# Patient Record
Sex: Male | Born: 1985 | Race: Black or African American | Hispanic: No | Marital: Single | Smoking: Former smoker
Health system: Southern US, Community
[De-identification: ages and names within clinical notes are randomized; demographics above are authoritative.]

## PROBLEM LIST (undated history)

## (undated) DIAGNOSIS — T783XXA Angioneurotic edema, initial encounter: Secondary | ICD-10-CM

## (undated) DIAGNOSIS — J45909 Unspecified asthma, uncomplicated: Secondary | ICD-10-CM

## (undated) HISTORY — PX: LEG SURGERY: SHX1003

## (undated) HISTORY — DX: Angioneurotic edema, initial encounter: T78.3XXA

---

## 2015-07-30 ENCOUNTER — Emergency Department (HOSPITAL_COMMUNITY)
Admission: EM | Admit: 2015-07-30 | Discharge: 2015-07-30 | Disposition: A | Discharge status: Home / Self Care | Payer: Medicaid - Out of State | Attending: Emergency Medicine | Admitting: Emergency Medicine

## 2015-07-30 ENCOUNTER — Encounter (HOSPITAL_COMMUNITY): Payer: Self-pay | Admitting: Emergency Medicine

## 2015-07-30 DIAGNOSIS — M25562 Pain in left knee: Secondary | ICD-10-CM | POA: Insufficient documentation

## 2015-07-30 DIAGNOSIS — F17210 Nicotine dependence, cigarettes, uncomplicated: Secondary | ICD-10-CM | POA: Insufficient documentation

## 2015-07-30 DIAGNOSIS — J45909 Unspecified asthma, uncomplicated: Secondary | ICD-10-CM | POA: Diagnosis not present

## 2015-07-30 HISTORY — DX: Unspecified asthma, uncomplicated: J45.909

## 2015-07-30 MED ORDER — IBUPROFEN 800 MG PO TABS: 800.0000 mg | ORAL_TABLET | Freq: Three times a day (TID) | ORAL | Status: DC

## 2015-07-30 NOTE — Discharge Instructions (Signed)
Cryotherapy °Cryotherapy means treatment with cold. Ice or gel packs can be used to reduce both pain and swelling. Ice is the most helpful within the first 24 to 48 hours after an injury or flare-up from overusing a muscle or joint. Sprains, strains, spasms, burning pain, shooting pain, and aches can all be eased with ice. Ice can also be used when recovering from surgery. Ice is effective, has very few side effects, and is safe for most people to use. °PRECAUTIONS  °Ice is not a safe treatment option for people with: °· Raynaud phenomenon. This is a condition affecting small blood vessels in the extremities. Exposure to cold may cause your problems to return. °· Cold hypersensitivity. There are many forms of cold hypersensitivity, including: °· Cold urticaria. Red, itchy hives appear on the skin when the tissues begin to warm after being iced. °· Cold erythema. This is a red, itchy rash caused by exposure to cold. °· Cold hemoglobinuria. Red blood cells break down when the tissues begin to warm after being iced. The hemoglobin that carry oxygen are passed into the urine because they cannot combine with blood proteins fast enough. °· Numbness or altered sensitivity in the area being iced. °If you have any of the following conditions, do not use ice until you have discussed cryotherapy with your caregiver: °· Heart conditions, such as arrhythmia, angina, or chronic heart disease. °· High blood pressure. °· Healing wounds or open skin in the area being iced. °· Current infections. °· Rheumatoid arthritis. °· Poor circulation. °· Diabetes. °Ice slows the blood flow in the region it is applied. This is beneficial when trying to stop inflamed tissues from spreading irritating chemicals to surrounding tissues. However, if you expose your skin to cold temperatures for too long or without the proper protection, you can damage your skin or nerves. Watch for signs of skin damage due to cold. °HOME CARE INSTRUCTIONS °Follow  these tips to use ice and cold packs safely. °· Place a dry or damp towel between the ice and skin. A damp towel will cool the skin more quickly, so you may need to shorten the time that the ice is used. °· For a more rapid response, add gentle compression to the ice. °· Ice for no more than 10 to 20 minutes at a time. The bonier the area you are icing, the less time it will take to get the benefits of ice. °· Check your skin after 5 minutes to make sure there are no signs of a poor response to cold or skin damage. °· Rest 20 minutes or more between uses. °· Once your skin is numb, you can end your treatment. You can test numbness by very lightly touching your skin. The touch should be so light that you do not see the skin dimple from the pressure of your fingertip. When using ice, most people will feel these normal sensations in this order: cold, burning, aching, and numbness. °· Do not use ice on someone who cannot communicate their responses to pain, such as small children or people with dementia. °HOW TO MAKE AN ICE PACK °Ice packs are the most common way to use ice therapy. Other methods include ice massage, ice baths, and cryosprays. Muscle creams that cause a cold, tingly feeling do not offer the same benefits that ice offers and should not be used as a substitute unless recommended by your caregiver. °To make an ice pack, do one of the following: °· Place crushed ice or a   bag of frozen vegetables in a sealable plastic bag. Squeeze out the excess air. Place this bag inside another plastic bag. Slide the bag into a pillowcase or place a damp towel between your skin and the bag. °· Mix 3 parts water with 1 part rubbing alcohol. Freeze the mixture in a sealable plastic bag. When you remove the mixture from the freezer, it will be slushy. Squeeze out the excess air. Place this bag inside another plastic bag. Slide the bag into a pillowcase or place a damp towel between your skin and the bag. °SEEK MEDICAL CARE  IF: °· You develop white spots on your skin. This may give the skin a blotchy (mottled) appearance. °· Your skin turns blue or pale. °· Your skin becomes waxy or hard. °· Your swelling gets worse. °MAKE SURE YOU:  °· Understand these instructions. °· Will watch your condition. °· Will get help right away if you are not doing well or get worse. °  °This information is not intended to replace advice given to you by your health care provider. Make sure you discuss any questions you have with your health care provider. °  °Document Released: 09/12/2010 Document Revised: 02/06/2014 Document Reviewed: 09/12/2010 °[employer]vier Interactive Patient Education ©2016 [employer] ° °Joint Pain °Joint pain, which is also called arthralgia, can be caused by many things. Joint pain often goes away when you follow your health care provider's instructions for relieving pain at home. However, joint pain can also be caused by conditions that require further treatment. Common causes of joint pain include: °· Bruising in the area of the joint. °· Overuse of the joint. °· Wear and tear on the joints that occur with aging (osteoarthritis). °· Various other forms of arthritis. °· A buildup of a crystal form of uric acid in the joint (gout). °· Infections of the joint (septic arthritis) or of the bone (osteomyelitis). °Your health care provider may recommend medicine to help with the pain. If your joint pain continues, additional tests may be needed to diagnose your condition. °HOME CARE INSTRUCTIONS °Watch your condition for any changes. Follow these instructions as directed to lessen the pain that you are feeling. °· Take medicines only as directed by your health care provider. °· Rest the affected area for as long as your health care provider says that you should. If directed to do so, raise the painful joint above the level of your heart while you are sitting or lying down. °· Do not do things that cause or worsen pain. °· If directed,  apply ice to the painful area: °¨ Put ice in a plastic bag. °¨ Place a towel between your skin and the bag. °¨ Leave the ice on for 20 minutes, 2-3 times per day. °· Wear an elastic bandage, splint, or sling as directed by your health care provider. Loosen the elastic bandage or splint if your fingers or toes become numb and tingle, or if they turn cold and blue. °· Begin exercising or stretching the affected area as directed by your health care provider. Ask your health care provider what types of exercise are safe for you. °· Keep all follow-up visits as directed by your health care provider. This is important. °SEEK MEDICAL CARE IF: °· Your pain increases, and medicine does not help. °· Your joint pain does not improve within 3 days. °· You have increased bruising or swelling. °· You have a fever. °· You lose 10 lb (4.5 kg) or more without trying. °SEEK IMMEDIATE   MEDICAL CARE IF: °· You are not able to move the joint. °· Your fingers or toes become numb or they turn cold and blue. °  °This information is not intended to replace advice given to you by your health care provider. Make sure you discuss any questions you have with your health care provider. °  °Document Released: 01/16/2005 Document Revised: 02/06/2014 Document Reviewed: 10/28/2013 °Elsevier Interactive Patient Education ©2016 [employer] ° °

## 2015-07-30 NOTE — ED Notes (Signed)
Patient c/o left knee pain x1 month. Denies injury to same. No swelling, numbness or tingling.

## 2015-07-30 NOTE — ED Provider Notes (Signed)
CSN: [id number]     Arrival date & time 07/30/15  0021 History   First MD Initiated Contact with Patient 07/30/15 0330     No chief complaint on file.    (Consider location/radiation/quality/duration/timing/severity/associated sxs/prior Treatment) HPI Comments: Patient with left knee pain x 1 month without known injury. No swelling. Pain has now started to affect the left ankle. He states pain is worse when he is at rest and better when he walking. No fever, redness. Pain affects the joints only, no calf or thigh pain.  The history is provided by the patient. No language interpreter was used.    Past Medical History  Diagnosis Date  . Asthma    Past Surgical History  Procedure Laterality Date  . Leg surgery     No family history on file. Social History  Substance Use Topics  . Smoking status: Current Every Day Smoker -- 0.50 packs/day    Types: Cigarettes  . Smokeless tobacco: None  . Alcohol Use: Yes    Review of Systems  Constitutional: Negative for fever and chills.  Musculoskeletal:       See HPI  Skin: Negative.   Neurological: Negative.  Negative for weakness and numbness.      Allergies  Review of patient's allergies indicates no known allergies.  Home Medications   Prior to Admission medications   Not on File   BP 127/84 mmHg  Pulse 102  Temp(Src) 98.5 F (36.9 C) (Oral)  Resp 20  Ht 6\' 1"  (1.854 m)  Wt 117.935 kg  BMI 34.31 kg/m2  SpO2 98% Physical Exam  Constitutional: He is oriented to person, place, and time. He appears well-developed and well-nourished. No distress.  Pulmonary/Chest: Effort normal.  Musculoskeletal: Normal range of motion.  Left knee and ankle has FROM without swelling, redness, or warmth. Joints stable without laxity.  Neurological: He is alert and oriented to person, place, and time.  Skin: Skin is warm and dry. No erythema.  Psychiatric: He has a normal mood and affect.    ED Course  Procedures (including critical  care time) Labs Review Labs Reviewed - No data to display  Imaging Review No results found. I have personally reviewed and evaluated these images and lab results as part of my medical decision-making.   EKG Interpretation None      MDM   Final diagnoses:  None    1. Left knee pain  No evidence septic joint, DVT or injury. Will provide ibuprofen and orthopedic referral if pain does not improve.    Elpidio AnisShari Arleta Ostrum, PA-C 07/30/15 0407  Cy BlamerApril Palumbo, MD 07/30/15 [phone]

## 2018-02-25 ENCOUNTER — Other Ambulatory Visit: Payer: Self-pay

## 2018-02-25 ENCOUNTER — Encounter (HOSPITAL_COMMUNITY): Payer: Self-pay | Admitting: Emergency Medicine

## 2018-02-25 ENCOUNTER — Emergency Department (HOSPITAL_COMMUNITY)
Admission: EM | Admit: 2018-02-25 | Discharge: 2018-02-25 | Disposition: A | Discharge status: Home / Self Care | Payer: Medicaid - Out of State | Attending: Emergency Medicine | Admitting: Emergency Medicine

## 2018-02-25 DIAGNOSIS — F17210 Nicotine dependence, cigarettes, uncomplicated: Secondary | ICD-10-CM | POA: Insufficient documentation

## 2018-02-25 DIAGNOSIS — J45909 Unspecified asthma, uncomplicated: Secondary | ICD-10-CM | POA: Insufficient documentation

## 2018-02-25 DIAGNOSIS — J111 Influenza due to unidentified influenza virus with other respiratory manifestations: Secondary | ICD-10-CM | POA: Insufficient documentation

## 2018-02-25 DIAGNOSIS — R6889 Other general symptoms and signs: Secondary | ICD-10-CM

## 2018-02-25 MED ORDER — OSELTAMIVIR PHOSPHATE 75 MG PO CAPS: 75.0000 mg | ORAL_CAPSULE | Freq: Two times a day (BID) | ORAL | 0 refills | Status: DC

## 2018-02-25 NOTE — ED Triage Notes (Signed)
States family recently diagnosed with flu and recently developed general body achy, hot and cold chills, and sore throat.

## 2018-02-25 NOTE — ED Provider Notes (Signed)
[facility]   CSN: [id number] Arrival date & time: 02/25/18  0940     History   Chief Complaint Chief Complaint  Patient presents with  . Sore Throat  . Cough  . Generalized Body Aches    HPI Jonathan Whitney is a 33 y.o. male.  HPI   33 year old male with a significant past medical history of asthma presents today with complaints of flulike symptoms.  Patient notes yesterday he developed dry nonproductive cough, fever, body aches, sore throat.  Patient notes both his wife and daughter were diagnosed with influenza after being tested and had similar symptoms.  Patient notes that he did receive the flu vaccine this year.  Patient notes that he has inhaler at home but has not been using it as he has not had any significant respiratory complaints.  Patient notes taking 1 Tamiflu this morning.  Past Medical History:  Diagnosis Date  . Asthma     There are no active problems to display for this patient.   Past Surgical History:  Procedure Laterality Date  . LEG SURGERY          Home Medications    Prior to Admission medications   Medication Sig Start Date End Date Taking? Authorizing Provider  ibuprofen (ADVIL,MOTRIN) 800 MG tablet Take 1 tablet (800 mg total) by mouth 3 (three) times daily. 07/30/15   Elpidio AnisUpstill, Shari, PA-C    Family History No family history on file.  Social History Social History   Tobacco Use  . Smoking status: Current Every Day Smoker    Packs/day: 0.50    Types: Cigarettes  Substance Use Topics  . Alcohol use: Yes  . Drug use: No     Allergies   Patient has no known allergies.   Review of Systems Review of Systems  All other systems reviewed and are negative.    Physical Exam Updated Vital Signs BP (!) 165/107   Pulse 80   Temp 99.2 F (37.3 C) (Oral)   Resp 18   Ht 6\' 1"  (1.854 m)   Wt (!) 138.3 kg   SpO2 99%   BMI 40.24 kg/m   Physical Exam Vitals signs and  nursing note reviewed.  Constitutional:      Appearance: He is well-developed.  HENT:     Head: Normocephalic and atraumatic.     Comments: Oropharynx with minor erythema, no exudate or swelling no pooling of secretions Eyes:     General: No scleral icterus.       Right eye: No discharge.        Left eye: No discharge.     Conjunctiva/sclera: Conjunctivae normal.     Pupils: Pupils are equal, round, and reactive to light.  Neck:     Musculoskeletal: Normal range of motion.     Vascular: No JVD.     Trachea: No tracheal deviation.  Cardiovascular:     Rate and Rhythm: Normal rate and regular rhythm.  Pulmonary:     Effort: Pulmonary effort is normal. No respiratory distress.     Breath sounds: No stridor. No wheezing or rales.  Neurological:     Mental Status: He is alert and oriented to person, place, and time.     Coordination: Coordination normal.  Psychiatric:        Behavior: Behavior normal.        Thought Content: Thought content normal.        Judgment: Judgment normal.  ED Treatments / Results  Labs (all labs ordered are listed, but only abnormal results are displayed) Labs Reviewed - No data to display  EKG None  Radiology No results found.  Procedures Procedures (including critical care time)  Medications Ordered in ED Medications - No data to display   Initial Impression / Assessment and Plan / ED Course  I have reviewed the triage vital signs and the nursing notes.  Pertinent labs & imaging results that were available during my care of the patient were reviewed by me and considered in my medical decision making (see chart for details).     33 year old male presents today with flulike symptoms.  Patient has 2 close sick contacts at home with positive flu he is exhibiting flulike symptoms.  I discussed testing and treatment, although I would recommend Tamiflu in this patient given his acute onset of symptoms within the timeframe for treatment and  with a history of asthma.  Patient understands risks of medication.  No testing necessary in this case he will receive Tamiflu, return immediately if develops any new or worsening signs or symptoms.  Patient verbalized understanding and agreement to today's plan had no further questions or concerns.  Final Clinical Impressions(s) / ED Diagnoses   Final diagnoses:  Flu-like symptoms    ED Discharge Orders    None       Rosalio Loud 02/25/18 0957    Blane Ohara, MD 02/26/18 [phone]

## 2018-02-25 NOTE — Discharge Instructions (Addendum)
Please read attached information. If you experience any new or worsening signs or symptoms please return to the emergency room for evaluation. Please follow-up with your primary care provider or specialist as discussed. Please use medication prescribed only as directed and discontinue taking if you have any concerning signs or symptoms.  You may return to work 24 hours after your fever dissipates.

## 2019-07-10 ENCOUNTER — Other Ambulatory Visit: Payer: Self-pay

## 2019-07-10 ENCOUNTER — Encounter: Payer: Self-pay | Admitting: Family Medicine

## 2019-07-10 ENCOUNTER — Ambulatory Visit (INDEPENDENT_AMBULATORY_CARE_PROVIDER_SITE_OTHER): Payer: No Typology Code available for payment source | Admitting: Family Medicine

## 2019-07-10 VITALS — BP 130/84 | HR 114 | Temp 97.2°F | Ht 71.0 in | Wt 284.0 lb

## 2019-07-10 DIAGNOSIS — J301 Allergic rhinitis due to pollen: Secondary | ICD-10-CM

## 2019-07-10 DIAGNOSIS — F341 Dysthymic disorder: Secondary | ICD-10-CM | POA: Diagnosis not present

## 2019-07-10 DIAGNOSIS — Z0000 Encounter for general adult medical examination without abnormal findings: Secondary | ICD-10-CM | POA: Diagnosis not present

## 2019-07-10 DIAGNOSIS — J4520 Mild intermittent asthma, uncomplicated: Secondary | ICD-10-CM

## 2019-07-10 DIAGNOSIS — R7989 Other specified abnormal findings of blood chemistry: Secondary | ICD-10-CM

## 2019-07-10 DIAGNOSIS — E559 Vitamin D deficiency, unspecified: Secondary | ICD-10-CM

## 2019-07-10 NOTE — Patient Instructions (Signed)
Health Maintenance, Male Adopting a healthy lifestyle and getting preventive care are important in promoting health and wellness. Ask your health care provider about:  The right schedule for you to have regular tests and exams.  Things you can do on your own to prevent diseases and keep yourself healthy. What should I know about diet, weight, and exercise? Eat a healthy diet   Eat a diet that includes plenty of vegetables, fruits, low-fat dairy products, and lean protein.  Do not eat a lot of foods that are high in solid fats, added sugars, or sodium. Maintain a healthy weight Body mass index (BMI) is a measurement that can be used to identify possible weight problems. It estimates body fat based on height and weight. Your health care provider can help determine your BMI and help you achieve or maintain a healthy weight. Get regular exercise Get regular exercise. This is one of the most important things you can do for your health. Most adults should:  Exercise for at least 150 minutes each week. The exercise should increase your heart rate and make you sweat (moderate-intensity exercise).  Do strengthening exercises at least twice a week. This is in addition to the moderate-intensity exercise.  Spend less time sitting. Even light physical activity can be beneficial. Watch cholesterol and blood lipids Have your blood tested for lipids and cholesterol at 36 years of age, then have this test every 5 years. You may need to have your cholesterol levels checked more often if:  Your lipid or cholesterol levels are high.  You are older than 36 years of age.  You are at high risk for heart disease. What should I know about cancer screening? Many types of cancers can be detected early and may often be prevented. Depending on your health history and family history, you may need to have cancer screening at various ages. This may include screening for:  Colorectal cancer.  Prostate  cancer.  Skin cancer.  Lung cancer. What should I know about heart disease, diabetes, and high blood pressure? Blood pressure and heart disease  High blood pressure causes heart disease and increases the risk of stroke. This is more likely to develop in people who have high blood pressure readings, are of African descent, or are overweight.  Talk with your health care provider about your target blood pressure readings.  Have your blood pressure checked: ? Every 3-5 years if you are 18-39 years of age. ? Every year if you are 40 years old or older.  If you are between the ages of 65 and 75 and are a current or former smoker, ask your health care provider if you should have a one-time screening for abdominal aortic aneurysm (AAA). Diabetes Have regular diabetes screenings. This checks your fasting blood sugar level. Have the screening done:  Once every three years after age 45 if you are at a normal weight and have a low risk for diabetes.  More often and at a younger age if you are overweight or have a high risk for diabetes. What should I know about preventing infection? Hepatitis B If you have a higher risk for hepatitis B, you should be screened for this virus. Talk with your health care provider to find out if you are at risk for hepatitis B infection. Hepatitis C Blood testing is recommended for:  Everyone born from 1945 through 1965.  Anyone with known risk factors for hepatitis C. Sexually transmitted infections (STIs)  You should be screened each year   for STIs, including gonorrhea and chlamydia, if: ? You are sexually active and are younger than 36 years of age. ? You are older than 36 years of age and your health care provider tells you that you are at risk for this type of infection. ? Your sexual activity has changed since you were last screened, and you are at increased risk for chlamydia or gonorrhea. Ask your health care provider if you are at risk.  Ask your  health care provider about whether you are at high risk for HIV. Your health care provider may recommend a prescription medicine to help prevent HIV infection. If you choose to take medicine to prevent HIV, you should first get tested for HIV. You should then be tested every 3 months for as long as you are taking the medicine. Follow these instructions at home: Lifestyle  Do not use any products that contain nicotine or tobacco, such as cigarettes, e-cigarettes, and chewing tobacco. If you need help quitting, ask your health care provider.  Do not use street drugs.  Do not share needles.  Ask your health care provider for help if you need support or information about quitting drugs. Alcohol use  Do not drink alcohol if your health care provider tells you not to drink.  If you drink alcohol: ? Limit how much you have to 0-2 drinks a day. ? Be aware of how much alcohol is in your drink. In the [place] one drink equals one 12 oz bottle of beer (355 mL), one 5 oz glass of wine (148 mL), or one 1 oz glass of hard liquor (44 mL). General instructions  Schedule regular health, dental, and eye exams.  Stay current with your vaccines.  Tell your health care provider if: ? You often feel depressed. ? You have ever been abused or do not feel safe at home. Summary  Adopting a healthy lifestyle and getting preventive care are important in promoting health and wellness.  Follow your health care provider's instructions about healthy diet, exercising, and getting tested or screened for diseases.  Follow your health care provider's instructions on monitoring your cholesterol and blood pressure. This information is not intended to replace advice given to you by your health care provider. Make sure you discuss any questions you have with your health care provider. Document Revised: 01/09/2018 Document Reviewed: 01/09/2018 Elsevier Patient Education  2020 [place].  BMI for Adults What is  BMI? Body mass index (BMI) is a number that is calculated from a person's weight and height. BMI can help estimate how much of a person's weight is composed of fat. BMI does not measure body fat directly. Rather, it is an alternative to procedures that directly measure body fat, which can be difficult and expensive. BMI can help identify people who may be at higher risk for certain medical problems. What are BMI measurements used for? BMI is used as a screening tool to identify possible weight problems. It helps determine whether a person is obese, overweight, a healthy weight, or underweight. BMI is useful for:  Identifying a weight problem that may be related to a medical condition or may increase the risk for medical problems.  Promoting changes, such as changes in diet and exercise, to help reach a healthy weight. BMI screening can be repeated to see if these changes are working. How is BMI calculated? BMI involves measuring your weight in relation to your height. Both height and weight are measured, and the BMI is calculated from those  numbers. This can be done either in [place] ([place] or metric measurements. Note that charts and online BMI calculators are available to help you find your BMI quickly and easily without having to do these calculations yourself. To calculate your BMI in English ([place] measurements:  1. Measure your weight in pounds (lb). 2. Multiply the number of pounds by 703. ? For example, for a person who weighs 180 lb, multiply that number by 703, which equals 126,540. 3. Measure your height in inches. Then multiply that number by itself to get a measurement called "inches squared." ? For example, for a person who is 70 inches tall, the "inches squared" measurement is 70 inches x 70 inches, which equals 4,900 inches squared. 4. Divide the total from step 2 (number of lb x 703) by the total from step 3 (inches squared): 126,540  4,900 = 25.8. This is your BMI. To calculate  your BMI in metric measurements: 1. Measure your weight in kilograms (kg). 2. Measure your height in meters (m). Then multiply that number by itself to get a measurement called "meters squared." ? For example, for a person who is 1.75 m tall, the "meters squared" measurement is 1.75 m x 1.75 m, which is equal to 3.1 meters squared. 3. Divide the number of kilograms (your weight) by the meters squared number. In this example: 70  3.1 = 22.6. This is your BMI. What do the results mean? BMI charts are used to identify whether you are underweight, normal weight, overweight, or obese. The following guidelines will be used:  Underweight: BMI less than 18.5.  Normal weight: BMI between 18.5 and 24.9.  Overweight: BMI between 25 and 29.9.  Obese: BMI of 30 or above. Keep these notes in mind:  Weight includes both fat and muscle, so someone with a muscular build, such as an athlete, may have a BMI that is higher than 24.9. In cases like these, BMI is not an accurate measure of body fat.  To determine if excess body fat is the cause of a BMI of 25 or higher, further assessments may need to be done by a health care provider.  BMI is usually interpreted in the same way for men and women. Where to find more information For more information about BMI, including tools to quickly calculate your BMI, go to these websites:  Centers for Disease Control and Prevention: http://www.wolf.info/  American Heart Association: www.heart.org  National Heart, Lung, and Blood Institute: https://wilson-eaton.com/ Summary  Body mass index (BMI) is a number that is calculated from a person's weight and height.  BMI may help estimate how much of a person's weight is composed of fat. BMI can help identify those who may be at higher risk for certain medical problems.  BMI can be measured using English measurements or metric measurements.  BMI charts are used to identify whether you are underweight, normal weight, overweight, or  obese. This information is not intended to replace advice given to you by your health care provider. Make sure you discuss any questions you have with your health care provider. Document Revised: 10/09/2018 Document Reviewed: 08/16/2018 Elsevier Patient Education  [place] 23-68 Years Old, Male Preventive care refers to lifestyle choices and visits with your health care provider that can promote health and wellness. This includes:  A yearly physical exam. This is also called an annual well check.  Regular dental and eye exams.  Immunizations.  Screening for certain conditions.  Healthy lifestyle choices, such  as eating a healthy diet, getting regular exercise, not using drugs or products that contain nicotine and tobacco, and limiting alcohol use. What can I expect for my preventive care visit? Physical exam Your health care provider will check:  Height and weight. These may be used to calculate body mass index (BMI), which is a measurement that tells if you are at a healthy weight.  Heart rate and blood pressure.  Your skin for abnormal spots. Counseling Your health care provider may ask you questions about:  Alcohol, tobacco, and drug use.  Emotional well-being.  Home and relationship well-being.  Sexual activity.  Eating habits.  Work and work Statistician. What immunizations do I need?  Influenza (flu) vaccine  This is recommended every year. Tetanus, diphtheria, and pertussis (Tdap) vaccine  You may need a Td booster every 10 years. Varicella (chickenpox) vaccine  You may need this vaccine if you have not already been vaccinated. Human papillomavirus (HPV) vaccine  If recommended by your health care provider, you may need three doses over 6 months. Measles, mumps, and rubella (MMR) vaccine  You may need at least one dose of MMR. You may also need a second dose. Meningococcal conjugate (MenACWY) vaccine  One dose is recommended  if you are 7-74 years old and a Market researcher living in a residence hall, or if you have one of several medical conditions. You may also need additional booster doses. Pneumococcal conjugate (PCV13) vaccine  You may need this if you have certain conditions and were not previously vaccinated. Pneumococcal polysaccharide (PPSV23) vaccine  You may need one or two doses if you smoke cigarettes or if you have certain conditions. Hepatitis A vaccine  You may need this if you have certain conditions or if you travel or work in places where you may be exposed to hepatitis A. Hepatitis B vaccine  You may need this if you have certain conditions or if you travel or work in places where you may be exposed to hepatitis B. Haemophilus influenzae type b (Hib) vaccine  You may need this if you have certain risk factors. You may receive vaccines as individual doses or as more than one vaccine together in one shot (combination vaccines). Talk with your health care provider about the risks and benefits of combination vaccines. What tests do I need? Blood tests  Lipid and cholesterol levels. These may be checked every 5 years starting at age 46.  Hepatitis C test.  Hepatitis B test. Screening   Diabetes screening. This is done by checking your blood sugar (glucose) after you have not eaten for a while (fasting).  Sexually transmitted disease (STD) testing. Talk with your health care provider about your test results, treatment options, and if necessary, the need for more tests. Follow these instructions at home: Eating and drinking   Eat a diet that includes fresh fruits and vegetables, whole grains, lean protein, and low-fat dairy products.  Take vitamin and mineral supplements as recommended by your health care provider.  Do not drink alcohol if your health care provider tells you not to drink.  If you drink alcohol: ? Limit how much you have to 0-2 drinks a day. ? Be aware of  how much alcohol is in your drink. In the [place] one drink equals one 12 oz bottle of beer (355 mL), one 5 oz glass of wine (148 mL), or one 1 oz glass of hard liquor (44 mL). Lifestyle  Take daily care of your teeth and gums.  Stay active. Exercise for at least 30 minutes on 5 or more days each week.  Do not use any products that contain nicotine or tobacco, such as cigarettes, e-cigarettes, and chewing tobacco. If you need help quitting, ask your health care provider.  If you are sexually active, practice safe sex. Use a condom or other form of protection to prevent STIs (sexually transmitted infections). What's next?  Go to your health care provider once a year for a well check visit.  Ask your health care provider how often you should have your eyes and teeth checked.  Stay up to date on all vaccines. This information is not intended to replace advice given to you by your health care provider. Make sure you discuss any questions you have with your health care provider. Document Revised: 01/10/2018 Document Reviewed: 01/10/2018 Elsevier Patient Education  [place].  Obesity, Adult Obesity is the condition of having too much total body fat. Being overweight or obese means that your weight is greater than what is considered healthy for your body size. Obesity is determined by a measurement called BMI. BMI is an estimate of body fat and is calculated from height and weight. For adults, a BMI of 30 or higher is considered obese. Obesity can lead to other health concerns and major illnesses, including:  Stroke.  Coronary artery disease (CAD).  Type 2 diabetes.  Some types of cancer, including cancers of the colon, breast, uterus, and gallbladder.  Osteoarthritis.  High blood pressure (hypertension).  High cholesterol.  Sleep apnea.  Gallbladder stones.  Infertility problems. What are the causes? Common causes of this condition include:  Eating daily meals that  are high in calories, sugar, and fat.  Being born with genes that may make you more likely to become obese.  Having a medical condition that causes obesity, including: ? Hypothyroidism. ? Polycystic ovarian syndrome (PCOS). ? Binge-eating disorder. ? Cushing syndrome.  Taking certain medicines, such as steroids, antidepressants, and seizure medicines.  Not being physically active (sedentary lifestyle).  Not getting enough sleep.  Drinking high amounts of sugar-sweetened beverages, such as soft drinks. What increases the risk? The following factors may make you more likely to develop this condition:  Having a family history of obesity.  Being a woman of African American descent.  Being a man of Hispanic descent.  Living in an area with limited access to: ? [place], recreation centers, or sidewalks. ? Healthy food choices, such as grocery stores and farmers' markets. What are the signs or symptoms? The main sign of this condition is having too much body fat. How is this diagnosed? This condition is diagnosed based on:  Your BMI. If you are an adult with a BMI of 30 or higher, you are considered obese.  Your waist circumference. This measures the distance around your waistline.  Your skinfold thickness. Your health care provider may gently pinch a fold of your skin and measure it. You may have other tests to check for underlying conditions. How is this treated? Treatment for this condition often includes changing your lifestyle. Treatment may include some or all of the following:  Dietary changes. This may include developing a healthy meal plan.  Regular physical activity. This may include activity that causes your heart to beat faster (aerobic exercise) and strength training. Work with your health care provider to design an exercise program that works for you.  Medicine to help you lose weight if you are unable to lose 1 pound a week after  6 weeks of healthy eating and more  physical activity.  Treating conditions that cause the obesity (underlying conditions).  Surgery. Surgical options may include gastric banding and gastric bypass. Surgery may be done if: ? Other treatments have not helped to improve your condition. ? You have a BMI of 40 or higher. ? You have life-threatening health problems related to obesity. Follow these instructions at home: Eating and drinking   Follow recommendations from your health care provider about what you eat and drink. Your health care provider may advise you to: ? Limit fast food, sweets, and processed snack foods. ? Choose low-fat options, such as low-fat milk instead of whole milk. ? Eat 5 or more servings of fruits or vegetables every day. ? Eat at home more often. This gives you more control over what you eat. ? Choose healthy foods when you eat out. ? Learn to read food labels. This will help you understand how much food is considered 1 serving. ? Learn what a healthy serving size is. ? Keep low-fat snacks available. ? Limit sugary drinks, such as soda, fruit juice, sweetened iced tea, and flavored milk.  Drink enough water to keep your urine pale yellow.  Do not follow a fad diet. Fad diets can be unhealthy and even dangerous. Physical activity  Exercise regularly, as told by your health care provider. ? Most adults should get up to 150 minutes of moderate-intensity exercise every week. ? Ask your health care provider what types of exercise are safe for you and how often you should exercise.  Warm up and stretch before being active.  Cool down and stretch after being active.  Rest between periods of activity. Lifestyle  Work with your health care provider and a dietitian to set a weight-loss goal that is healthy and reasonable for you.  Limit your screen time.  Find ways to reward yourself that do not involve food.  Do not drink alcohol if: ? Your health care provider tells you not to drink. ? You  are pregnant, may be pregnant, or are planning to become pregnant.  If you drink alcohol: ? Limit how much you use to:  0-1 drink a day for women.  0-2 drinks a day for men. ? Be aware of how much alcohol is in your drink. In the [place] one drink equals one 12 oz bottle of beer (355 mL), one 5 oz glass of wine (148 mL), or one 1 oz glass of hard liquor (44 mL). General instructions  Keep a weight-loss journal to keep track of the food you eat and how much exercise you get.  Take over-the-counter and prescription medicines only as told by your health care provider.  Take vitamins and supplements only as told by your health care provider.  Consider joining a support group. Your health care provider may be able to recommend a support group.  Keep all follow-up visits as told by your health care provider. This is important. Contact a health care provider if:  You are unable to meet your weight loss goal after 6 weeks of dietary and lifestyle changes. Get help right away if you are having:  Trouble breathing.  Suicidal thoughts or behaviors. Summary  Obesity is the condition of having too much total body fat.  Being overweight or obese means that your weight is greater than what is considered healthy for your body size.  Work with your health care provider and a dietitian to set a weight-loss goal that is healthy and  reasonable for you.  Exercise regularly, as told by your health care provider. Ask your health care provider what types of exercise are safe for you and how often you should exercise. This information is not intended to replace advice given to you by your health care provider. Make sure you discuss any questions you have with your health care provider. Document Revised: 09/20/2017 Document Reviewed: 09/20/2017 Elsevier Patient Education  2020 [employer].

## 2019-07-10 NOTE — Progress Notes (Addendum)
New Patient Office Visit  Subjective:  Patient ID: Jonathan Whitney, male    DOB: [birth year]  Age: 34 y.o. MRN: [id number]  CC:  Chief Complaint  Patient presents with  . New Patient (Initial Visit)    Patient is here today as a new patient to establish care. There is nothing via Care Everywhere.  . Annual Exam    He is also here today for a CPE. He may be due for Tdap but we do not have any previous records. He will sign a records request for [place] to receive those.    HPI Jonathan Whitney presents for a complete physical and establishment of care.  Has moved to this area from [place] to be closer to his children.  He has no other family down here he works as a Associate Professor.  He admits to some loneliness.  He is not currently physically active.  He does smoke marijuana on a regular basis.  He does not smoke cigarettes.  He rarely drinks alcohol.  Mom died back 2023-01-31 from a lung cancer.  Past Medical History:  Diagnosis Date  . Asthma     Past Surgical History:  Procedure Laterality Date  . LEG SURGERY      Family History  Problem Relation Age of Onset  . Cancer Mother     Social History   Socioeconomic History  . Marital status: Single    Spouse name: Not on file  . Number of children: Not on file  . Years of education: Not on file  . Highest education level: Not on file  Occupational History  . Not on file  Tobacco Use  . Smoking status: Former Smoker    Packs/day: 0.50    Types: Cigarettes  . Smokeless tobacco: Never Used  Substance and Sexual Activity  . Alcohol use: Yes    Comment: rarely  . Drug use: Yes    Types: Marijuana  . Sexual activity: Not on file  Other Topics Concern  . Not on file  Social History Narrative  . Not on file   Social Determinants of Health   Financial Resource Strain:   . Difficulty of Paying Living Expenses:   Food Insecurity:   . Worried About Programme researcher, broadcasting/film/video in the Last Year:   . Barista in the Last Year:     Transportation Needs:   . Freight forwarder (Medical):   Marland Kitchen Lack of Transportation (Non-Medical):   Physical Activity:   . Days of Exercise per Week:   . Minutes of Exercise per Session:   Stress:   . Feeling of Stress :   Social Connections:   . Frequency of Communication with Friends and Family:   . Frequency of Social Gatherings with Friends and Family:   . Attends Religious Services:   . Active Member of Clubs or Organizations:   . Attends Banker Meetings:   Marland Kitchen Marital Status:   Intimate Partner Violence:   . Fear of Current or Ex-Partner:   . Emotionally Abused:   Marland Kitchen Physically Abused:   . Sexually Abused:     ROS Review of Systems  Constitutional: Negative.   HENT: Negative.   Eyes: Negative for photophobia and visual disturbance.  Respiratory: Negative.   Cardiovascular: Negative.   Gastrointestinal: Negative.   Genitourinary: Negative.   Musculoskeletal: Negative for gait problem and joint swelling.  Allergic/Immunologic: Negative for immunocompromised state.  Neurological: Negative for speech difficulty and light-headedness.  Hematological:  Does not bruise/bleed easily.   Depression screen PHQ 2/9 07/10/2019  Decreased Interest 2  Down, Depressed, Hopeless 0  PHQ - 2 Score 2  Altered sleeping 0  Tired, decreased energy 1  Change in appetite 0  Feeling bad or failure about yourself  0  Trouble concentrating 1  Moving slowly or fidgety/restless 0  Suicidal thoughts 0  PHQ-9 Score 4  Difficult doing work/chores Somewhat difficult    Objective:   Today's Vitals: BP 130/84 (BP Location: Left Arm, Patient Position: Sitting, Cuff Size: Large)   Pulse (!) 114   Temp (!) 97.2 F (36.2 C) (Temporal)   Ht 5\' 11"  (1.803 m)   Wt 284 lb (128.8 kg)   SpO2 97%   BMI 39.61 kg/m   Physical Exam Vitals and nursing note reviewed.  Constitutional:      General: He is not in acute distress.    Appearance: Normal appearance. He is obese. He is not  ill-appearing, toxic-appearing or diaphoretic.  HENT:     Head: Normocephalic and atraumatic.     Right Ear: Tympanic membrane, ear canal and external ear normal.     Left Ear: Tympanic membrane, ear canal and external ear normal.     Mouth/Throat:     Mouth: Mucous membranes are dry.     Pharynx: Oropharynx is clear. No oropharyngeal exudate or posterior oropharyngeal erythema.  Eyes:     General: No scleral icterus.       Right eye: No discharge.        Left eye: No discharge.     Extraocular Movements: Extraocular movements intact.     Conjunctiva/sclera: Conjunctivae normal.     Pupils: Pupils are equal, round, and reactive to light.  Neck:     Vascular: No carotid bruit.  Cardiovascular:     Rate and Rhythm: Normal rate and regular rhythm.  Pulmonary:     Effort: Pulmonary effort is normal.     Breath sounds: Normal breath sounds.  Abdominal:     General: Abdomen is flat. Bowel sounds are normal. There is no distension.     Palpations: Abdomen is soft. There is no mass.     Tenderness: There is no abdominal tenderness. There is no guarding or rebound.     Hernia: No hernia is present. There is no hernia in the left inguinal area or right inguinal area.  Genitourinary:    Pubic Area: No rash.      Penis: Normal and circumcised. No hypospadias, erythema, tenderness, discharge, swelling or lesions.      Testes:        Right: Mass, tenderness or swelling not present. Right testis is descended.        Left: Mass, tenderness or swelling not present. Left testis is descended.  Musculoskeletal:     Cervical back: Normal range of motion and neck supple. No rigidity or tenderness.     Right lower leg: No edema.     Left lower leg: No edema.  Lymphadenopathy:     Cervical: No cervical adenopathy.     Lower Body: No right inguinal adenopathy. No left inguinal adenopathy.  Skin:    Capillary Refill: Capillary refill takes less than 2 seconds.  Neurological:     General: No focal  deficit present.     Mental Status: He is alert and oriented to person, place, and time.  Psychiatric:        Mood and Affect: Mood normal.  Behavior: Behavior normal.     Assessment & Plan:   Problem List Items Addressed This Visit      Respiratory   Reactive airway disease   Relevant Medications   albuterol (VENTOLIN HFA) 108 (90 Base) MCG/ACT inhaler   Seasonal allergic rhinitis due to pollen   Relevant Medications   cetirizine (ZYRTEC) 10 MG tablet   mometasone (NASONEX) 50 MCG/ACT nasal spray     Other   Dysthymia   Relevant Orders   Ambulatory referral to Psychology   Morbid obesity (HCC)   Relevant Orders   Amb Ref to Medical Weight Management   VITAMIN D 25 Hydroxy (Vit-D Deficiency, Fractures) (Completed)   Healthcare maintenance - Primary   Relevant Orders   CBC (Completed)   Comprehensive metabolic panel (Completed)   HIV Antibody (routine testing w rflx) (Completed)   Lipid panel (Completed)   Urinalysis, Routine w reflex microscopic (Completed)   Urine cytology ancillary only (Completed)   Vitamin D deficiency   Relevant Medications   Vitamin D, Ergocalciferol, (DRISDOL) 1.25 MG (50000 UNIT) CAPS capsule   Elevated LFTs   Relevant Orders   Hepatitis B Surface AntiGEN   Hepatitis B surface antibody,quantitative   Hepatitis C antibody   Hepatic function panel      Outpatient Encounter Medications as of 07/10/2019  Medication Sig  . albuterol (VENTOLIN HFA) 108 (90 Base) MCG/ACT inhaler Inhale 1-2 puffs into the lungs every 6 (six) hours as needed for wheezing or shortness of breath.  . cetirizine (ZYRTEC) 10 MG tablet Take 1 tablet (10 mg total) by mouth daily.  . mometasone (NASONEX) 50 MCG/ACT nasal spray Place 2 sprays into the nose daily.  . Vitamin D, Ergocalciferol, (DRISDOL) 1.25 MG (50000 UNIT) CAPS capsule Take 1 capsule (50,000 Units total) by mouth every 7 (seven) days.  . [DISCONTINUED] ibuprofen (ADVIL,MOTRIN) 800 MG tablet Take 1  tablet (800 mg total) by mouth 3 (three) times daily.  . [DISCONTINUED] oseltamivir (TAMIFLU) 75 MG capsule Take 1 capsule (75 mg total) by mouth every 12 (twelve) hours.   No facility-administered encounter medications on file as of 07/10/2019.    Follow-up: Return in 6 months (on 01/09/2020), or return fasting for labs. follow up depends labs results and your needs.  Given information about health maintenance disease prevention, BMI, obesity and dysthymia.  Agrees to go to counseling and weight loss management.  Asked him to increase his physical activity.  Discuss some of the negative aspects of marijuana use.  Mliss Sax, MD

## 2019-07-14 ENCOUNTER — Other Ambulatory Visit: Payer: No Typology Code available for payment source

## 2019-07-17 ENCOUNTER — Other Ambulatory Visit: Payer: Self-pay

## 2019-07-17 ENCOUNTER — Other Ambulatory Visit (HOSPITAL_COMMUNITY)
Admission: RE | Admit: 2019-07-17 | Discharge: 2019-07-17 | Disposition: A | Discharge status: Home / Self Care | Payer: No Typology Code available for payment source | Source: Ambulatory Visit | Attending: Family Medicine | Admitting: Family Medicine

## 2019-07-17 ENCOUNTER — Other Ambulatory Visit (INDEPENDENT_AMBULATORY_CARE_PROVIDER_SITE_OTHER): Payer: No Typology Code available for payment source

## 2019-07-17 DIAGNOSIS — Z0000 Encounter for general adult medical examination without abnormal findings: Secondary | ICD-10-CM | POA: Diagnosis present

## 2019-07-17 LAB — URINALYSIS, ROUTINE W REFLEX MICROSCOPIC
Bilirubin Urine: NEGATIVE
Hgb urine dipstick: NEGATIVE
Ketones, ur: NEGATIVE
Leukocytes,Ua: NEGATIVE
Nitrite: NEGATIVE
RBC / HPF: NONE SEEN (ref 0–?)
Specific Gravity, Urine: 1.02 (ref 1.000–1.030)
Total Protein, Urine: NEGATIVE
Urine Glucose: NEGATIVE
Urobilinogen, UA: 0.2 (ref 0.0–1.0)
pH: 6 (ref 5.0–8.0)

## 2019-07-17 LAB — CBC
HCT: 46.3 % (ref 39.0–52.0)
Hemoglobin: 15.8 g/dL (ref 13.0–17.0)
MCHC: 34.1 g/dL (ref 30.0–36.0)
MCV: 91.8 fl (ref 78.0–100.0)
Platelets: 283 10*3/uL (ref 150.0–400.0)
RBC: 5.04 Mil/uL (ref 4.22–5.81)
RDW: 12.7 % (ref 11.5–15.5)
WBC: 6.3 10*3/uL (ref 4.0–10.5)

## 2019-07-17 LAB — VITAMIN D 25 HYDROXY (VIT D DEFICIENCY, FRACTURES): VITD: <7 ng/mL — ABNORMAL LOW (ref 30.00–100.00)

## 2019-07-17 LAB — LIPID PANEL
Cholesterol: 122 mg/dL (ref 0–200)
HDL: 35.3 mg/dL — ABNORMAL LOW (ref >39.00)
LDL Cholesterol: 67 mg/dL (ref 0–99)
NonHDL: 86.45
Total CHOL/HDL Ratio: 3
Triglycerides: 98 mg/dL (ref 0.0–149.0)
VLDL: 19.6 mg/dL (ref 0.0–40.0)

## 2019-07-17 LAB — COMPREHENSIVE METABOLIC PANEL
ALT: 78 U/L — ABNORMAL HIGH (ref 0–53)
AST: 58 U/L — ABNORMAL HIGH (ref 0–37)
Albumin: 4.2 g/dL (ref 3.5–5.2)
Alkaline Phosphatase: 146 U/L — ABNORMAL HIGH (ref 39–117)
BUN: 8 mg/dL (ref 6–23)
CO2: 26 mEq/L (ref 19–32)
Calcium: 9.7 mg/dL (ref 8.4–10.5)
Chloride: 104 mEq/L (ref 96–112)
Creatinine, Ser: 1.03 mg/dL (ref 0.40–1.50)
GFR: 99 mL/min (ref >60.00)
Glucose, Bld: 81 mg/dL (ref 70–99)
Potassium: 3.9 mEq/L (ref 3.5–5.1)
Sodium: 138 mEq/L (ref 135–145)
Total Bilirubin: 0.5 mg/dL (ref 0.2–1.2)
Total Protein: 8 g/dL (ref 6.0–8.3)

## 2019-07-18 LAB — URINE CYTOLOGY ANCILLARY ONLY
Chlamydia: NEGATIVE
Comment: NEGATIVE
Comment: NEGATIVE
Trichomonas: NEGATIVE

## 2019-07-18 LAB — HIV ANTIBODY (ROUTINE TESTING W REFLEX): HIV 1&2 Ab, 4th Generation: NONREACTIVE

## 2019-07-21 ENCOUNTER — Encounter: Payer: Self-pay | Admitting: Family Medicine

## 2019-07-21 MED ORDER — VITAMIN D (ERGOCALCIFEROL) 1.25 MG (50000 UNIT) PO CAPS: 50000.0000 [IU] | ORAL_CAPSULE | ORAL | 5 refills | Status: DC

## 2019-07-21 NOTE — Addendum Note (Signed)
Addended by: Nadene Rubins A on: 07/21/2019 11:20 AM   Modules accepted: Orders

## 2019-07-22 ENCOUNTER — Telehealth: Payer: Self-pay | Admitting: Family Medicine

## 2019-07-22 DIAGNOSIS — J301 Allergic rhinitis due to pollen: Secondary | ICD-10-CM | POA: Insufficient documentation

## 2019-07-22 DIAGNOSIS — E559 Vitamin D deficiency, unspecified: Secondary | ICD-10-CM | POA: Insufficient documentation

## 2019-07-22 DIAGNOSIS — R7989 Other specified abnormal findings of blood chemistry: Secondary | ICD-10-CM | POA: Insufficient documentation

## 2019-07-22 DIAGNOSIS — J45909 Unspecified asthma, uncomplicated: Secondary | ICD-10-CM | POA: Insufficient documentation

## 2019-07-22 MED ORDER — ALBUTEROL SULFATE HFA 108 (90 BASE) MCG/ACT IN AERS: 1.0000 | INHALATION_SPRAY | Freq: Four times a day (QID) | RESPIRATORY_TRACT | 0 refills | Status: AC | PRN

## 2019-07-22 MED ORDER — MOMETASONE FUROATE 50 MCG/ACT NA SUSP
2.0000 | Freq: Every day | NASAL | 1 refills | Status: DC
Start: 2019-07-22 — End: 2019-09-26

## 2019-07-22 MED ORDER — CETIRIZINE HCL 10 MG PO TABS: 10.0000 mg | ORAL_TABLET | Freq: Every day | ORAL | 1 refills | Status: DC

## 2019-07-22 NOTE — Addendum Note (Signed)
Addended by: Andrez Grime on: 07/22/2019 04:03 PM   Modules accepted: Orders

## 2019-07-22 NOTE — Telephone Encounter (Signed)
Patient aware and will pick up Rx appointment scheduled to come in for labs.

## 2019-07-22 NOTE — Telephone Encounter (Signed)
Patient calling states that his allergies have been bothering him would like something sent in for this and also would like to know if you would send in inhaler for patient to try for allergies. Please advise.

## 2019-07-22 NOTE — Telephone Encounter (Signed)
Patient states he is having stuffy nose due to allergies. Flonase does not help much. He would like Dr. Doreene Burke to call in something for his allergies and an asthma pump because his asthma is flaring up also from the allergies. He did not feel he needs to be seen, just wanted prescriptions sent in.

## 2019-07-22 NOTE — Telephone Encounter (Signed)
Have sent some meds to the pharmacy. Needs to return for labs.

## 2019-07-25 ENCOUNTER — Other Ambulatory Visit: Payer: No Typology Code available for payment source

## 2019-08-14 ENCOUNTER — Other Ambulatory Visit: Payer: Self-pay

## 2019-08-14 ENCOUNTER — Encounter: Payer: Self-pay | Admitting: Family

## 2019-08-14 ENCOUNTER — Telehealth (INDEPENDENT_AMBULATORY_CARE_PROVIDER_SITE_OTHER): Payer: No Typology Code available for payment source | Admitting: Family

## 2019-08-14 VITALS — Ht 71.0 in | Wt 284.0 lb

## 2019-08-14 DIAGNOSIS — T50905A Adverse effect of unspecified drugs, medicaments and biological substances, initial encounter: Secondary | ICD-10-CM

## 2019-08-14 DIAGNOSIS — R0981 Nasal congestion: Secondary | ICD-10-CM | POA: Diagnosis not present

## 2019-08-14 DIAGNOSIS — J302 Other seasonal allergic rhinitis: Secondary | ICD-10-CM | POA: Diagnosis not present

## 2019-08-14 MED ORDER — PREDNISONE 20 MG PO TABS: 40.0000 mg | ORAL_TABLET | Freq: Every day | ORAL | 0 refills | Status: DC

## 2019-08-14 MED ORDER — FLUTICASONE PROPIONATE 50 MCG/ACT NA SUSP: 2.0000 | Freq: Every day | NASAL | 6 refills | Status: DC

## 2019-08-14 NOTE — Progress Notes (Signed)
   Virtual Visit via Video   I connected with patient on 08/14/19 at  1:40 PM EDT by a video enabled telemedicine application and verified that I am speaking with the correct person using two identifiers.  Location patient: Home Location provider: [facility], Office Persons participating in the virtual visit: Patient, Provider, CMA   I discussed the limitations of evaluation and management by telemedicine and the availability of in person appointments. The patient expressed understanding and agreed to proceed.  Subjective:   HPI:  34 year old male requests a video visit with c/o nasal congestion x 2 months. He has been taking Affrin and Zyrtec daily for 2 months that do not help.   ROS:   See pertinent positives and negatives per HPI.  Patient Active Problem List   Diagnosis Date Noted  . Reactive airway disease 07/22/2019  . Seasonal allergic rhinitis due to pollen 07/22/2019  . Vitamin D deficiency 07/22/2019  . Elevated LFTs 07/22/2019  . Dysthymia 07/10/2019  . Morbid obesity (HCC) 07/10/2019  . Healthcare maintenance 07/10/2019    Social History   Tobacco Use  . Smoking status: Former Smoker    Packs/day: 0.50    Types: Cigarettes  . Smokeless tobacco: Never Used  Substance Use Topics  . Alcohol use: Yes    Comment: rarely    Current Outpatient Medications:  .  albuterol (VENTOLIN HFA) 108 (90 Base) MCG/ACT inhaler, Inhale 1-2 puffs into the lungs every 6 (six) hours as needed for wheezing or shortness of breath., Disp: 18 g, Rfl: 0 .  mometasone (NASONEX) 50 MCG/ACT nasal spray, Place 2 sprays into the nose daily., Disp: 17 g, Rfl: 1 .  Vitamin D, Ergocalciferol, (DRISDOL) 1.25 MG (50000 UNIT) CAPS capsule, Take 1 capsule (50,000 Units total) by mouth every 7 (seven) days., Disp: 5 capsule, Rfl: 5 .  cetirizine (ZYRTEC) 10 MG tablet, Take 1 tablet (10 mg total) by mouth daily. (Patient not taking: Reported on 08/14/2019), Disp: 30 tablet, Rfl: 1 .   fluticasone (FLONASE) 50 MCG/ACT nasal spray, Place 2 sprays into both nostrils daily., Disp: 16 g, Rfl: 6 .  predniSONE (DELTASONE) 20 MG tablet, Take 2 tablets (40 mg total) by mouth daily with breakfast., Disp: 10 tablet, Rfl: 0  No Known Allergies  Objective:   Ht 5\' 11"  (1.803 m)   Wt 284 lb (128.8 kg) Comment: pt reported  BMI 39.61 kg/m   Patient is well-developed, well-nourished in no acute distress.  Resting comfortably at home.  Head is normocephalic, atraumatic.  No labored breathing.  Speech is clear and coherent with logical content.  Patient is alert and oriented at baseline.   Assessment and Plan:    Virgal was seen today for allergies.  Diagnoses and all orders for this visit:  Seasonal allergies  Nasal congestion  Adverse effect of drug, initial encounter  Other orders -     fluticasone (FLONASE) 50 MCG/ACT nasal spray; Place 2 sprays into both nostrils daily. -     predniSONE (DELTASONE) 20 MG tablet; Take 2 tablets (40 mg total) by mouth daily with breakfast.  Advised patient to discontinue the use of Affrin as it will cause rebound congestion. Call the office with any questions or concerns. Recheck as scheduled and needed   Curley Spice, Eulis Foster 08/14/2019

## 2019-08-20 ENCOUNTER — Emergency Department (HOSPITAL_COMMUNITY)
Admission: EM | Admit: 2019-08-20 | Discharge: 2019-08-21 | Disposition: A | Discharge status: Home / Self Care | Payer: No Typology Code available for payment source | Attending: Emergency Medicine | Admitting: Emergency Medicine

## 2019-08-20 ENCOUNTER — Encounter (HOSPITAL_COMMUNITY): Payer: Self-pay | Admitting: Emergency Medicine

## 2019-08-20 ENCOUNTER — Other Ambulatory Visit: Payer: Self-pay

## 2019-08-20 DIAGNOSIS — J3489 Other specified disorders of nose and nasal sinuses: Secondary | ICD-10-CM | POA: Insufficient documentation

## 2019-08-20 DIAGNOSIS — R0981 Nasal congestion: Secondary | ICD-10-CM | POA: Insufficient documentation

## 2019-08-20 DIAGNOSIS — Z5321 Procedure and treatment not carried out due to patient leaving prior to being seen by health care provider: Secondary | ICD-10-CM | POA: Insufficient documentation

## 2019-08-20 NOTE — ED Triage Notes (Signed)
Patient reports chronic sinus pressure/nasal congestion for several months unrelieved by OTC nasal spray .

## 2019-08-21 NOTE — ED Notes (Signed)
Pt called x 3 no answer 

## 2019-08-28 ENCOUNTER — Emergency Department (HOSPITAL_COMMUNITY)
Admission: EM | Admit: 2019-08-28 | Discharge: 2019-08-29 | Disposition: A | Discharge status: Home / Self Care | Payer: No Typology Code available for payment source | Attending: Emergency Medicine | Admitting: Emergency Medicine

## 2019-08-28 ENCOUNTER — Encounter (HOSPITAL_COMMUNITY): Payer: Self-pay | Admitting: Emergency Medicine

## 2019-08-28 ENCOUNTER — Emergency Department (HOSPITAL_COMMUNITY): Payer: No Typology Code available for payment source

## 2019-08-28 ENCOUNTER — Other Ambulatory Visit: Payer: Self-pay

## 2019-08-28 DIAGNOSIS — Z5321 Procedure and treatment not carried out due to patient leaving prior to being seen by health care provider: Secondary | ICD-10-CM | POA: Insufficient documentation

## 2019-08-28 DIAGNOSIS — R05 Cough: Secondary | ICD-10-CM | POA: Diagnosis not present

## 2019-08-28 DIAGNOSIS — R079 Chest pain, unspecified: Secondary | ICD-10-CM | POA: Insufficient documentation

## 2019-08-28 DIAGNOSIS — R0602 Shortness of breath: Secondary | ICD-10-CM | POA: Diagnosis present

## 2019-08-28 DIAGNOSIS — R519 Headache, unspecified: Secondary | ICD-10-CM | POA: Diagnosis not present

## 2019-08-28 DIAGNOSIS — R0981 Nasal congestion: Secondary | ICD-10-CM | POA: Diagnosis not present

## 2019-08-28 LAB — BASIC METABOLIC PANEL
Anion gap: 13 (ref 5–15)
BUN: 7 mg/dL (ref 6–20)
CO2: 23 mmol/L (ref 22–32)
Calcium: 9.7 mg/dL (ref 8.9–10.3)
Chloride: 101 mmol/L (ref 98–111)
Creatinine, Ser: 1.12 mg/dL (ref 0.61–1.24)
GFR calc Af Amer: >60 mL/min (ref >60)
GFR calc non Af Amer: >60 mL/min (ref >60)
Glucose, Bld: 116 mg/dL — ABNORMAL HIGH (ref 70–99)
Potassium: 3.6 mmol/L (ref 3.5–5.1)
Sodium: 137 mmol/L (ref 135–145)

## 2019-08-28 LAB — PROTIME-INR
INR: 1.1 (ref 0.8–1.2)
Prothrombin Time: 13.7 seconds (ref 11.4–15.2)

## 2019-08-28 LAB — CBC
HCT: 44.7 % (ref 39.0–52.0)
Hemoglobin: 14.6 g/dL (ref 13.0–17.0)
MCH: 29.6 pg (ref 26.0–34.0)
MCHC: 32.7 g/dL (ref 30.0–36.0)
MCV: 90.5 fL (ref 80.0–100.0)
Platelets: 295 10*3/uL (ref 150–400)
RBC: 4.94 MIL/uL (ref 4.22–5.81)
RDW: 12.5 % (ref 11.5–15.5)
WBC: 10.2 10*3/uL (ref 4.0–10.5)
nRBC: 0 % (ref 0.0–0.2)

## 2019-08-28 MED ORDER — SODIUM CHLORIDE 0.9% FLUSH: 3.0000 mL | Freq: Once | INTRAVENOUS | Status: DC

## 2019-08-28 NOTE — ED Triage Notes (Signed)
Patient reports substernal chest pain this morning with mild SOB , occasional dry cough with nasal congestion and headache , no emesis or diaphoresis .

## 2019-08-29 LAB — TROPONIN I (HIGH SENSITIVITY)
Troponin I (High Sensitivity): 4 ng/L (ref <18)
Troponin I (High Sensitivity): 5 ng/L (ref <18)

## 2019-08-29 NOTE — ED Notes (Signed)
Called pt name x3 for VS recheck. No response from pt.  

## 2019-09-26 ENCOUNTER — Encounter: Payer: Self-pay | Admitting: Allergy & Immunology

## 2019-09-26 ENCOUNTER — Ambulatory Visit (INDEPENDENT_AMBULATORY_CARE_PROVIDER_SITE_OTHER): Payer: No Typology Code available for payment source | Admitting: Allergy & Immunology

## 2019-09-26 ENCOUNTER — Other Ambulatory Visit: Payer: Self-pay

## 2019-09-26 VITALS — BP 130/80 | HR 104 | Resp 18 | Ht 73.0 in | Wt 276.0 lb

## 2019-09-26 DIAGNOSIS — J4520 Mild intermittent asthma, uncomplicated: Secondary | ICD-10-CM | POA: Diagnosis not present

## 2019-09-26 DIAGNOSIS — J3089 Other allergic rhinitis: Secondary | ICD-10-CM

## 2019-09-26 DIAGNOSIS — J302 Other seasonal allergic rhinitis: Secondary | ICD-10-CM | POA: Diagnosis not present

## 2019-09-26 MED ORDER — XHANCE 93 MCG/ACT NA EXHU: 2.0000 | INHALANT_SUSPENSION | Freq: Two times a day (BID) | NASAL | 5 refills | Status: DC

## 2019-09-26 MED ORDER — AZELASTINE HCL 0.1 % NA SOLN
2.0000 | Freq: Two times a day (BID) | NASAL | 5 refills | Status: DC | PRN
Start: 2019-09-26 — End: 2020-05-07

## 2019-09-26 NOTE — Progress Notes (Signed)
NEW PATIENT  Date of Service/Encounter:  09/26/19  Referring provider: Mliss Sax, MD   Assessment:   Mild intermittent asthma, uncomplicated  Seasonal and perennial allergic rhinitis (mourse, mixed feather, ragweed, trees, indoor molds, outdoor molds, dust mites, cat, dog and cockroach)  Plan/Recommendations:   1. Mild intermittent asthma, uncomplicated - Lung testing deferred since you seemed to be doing fairly well. - Continue with albuterol 4 puffs every 4-6 hours. - There is no need for a controller medication at this time.  2. Chronic rhinitis - Testing today showed: mourse, mixed feather, ragweed, trees, indoor molds, outdoor molds, dust mites, cat, dog and cockroach - Copy of test results provided.  - Avoidance measures provided. - Stop taking: Flonase - Continue with: Afrin (twice daily for TWO WEEKS, once daily for TWO WEEK, once every M/W/F for FOUR WEEKS, then STOP) - Start taking: Zyrtec (cetirizine) 10mg  tablet once daily, Xhance (fluticasone) 1-2 sprays per nostril daily and Astelin (azelastine) 2 sprays per nostril 1-2 times daily as needed  - We will send in the script to the [facility] outpatient pharmacy, and they will call you to confirm your shipping address. - You can review how to use the device here: [email or web address]://www.xhance.com - Ask to be enrolled in the auto-refill program so you can get a year for free. - Consider nasal saline rinses 1-2 times daily to remove allergens from the nasal cavities as well as help with mucous clearance (this is especially helpful to do before the nasal sprays are given) - Consider allergy shots as a means of long-term control. - Allergy shots "re-train" and "reset" the immune system to ignore environmental allergens and decrease the resulting immune response to those allergens (sneezing, itchy watery eyes, runny nose, nasal congestion, etc).  - Allergy shots improve symptoms in 75-85% of patients.  - We can discuss more  at the next appointment if the medications are not working for you.  3. Follow up in two months or earlier if needed.    Subjective:   Jonathan Whitney is a 34 y.o. male presenting today for evaluation of  Chief Complaint  Patient presents with  . Nasal Congestion    using Afrin x5 months. when he uses a netti pot he says that he feels it in his ears and it only relieves his symptoms for about 10 minutes then he has to use his Afrin again.     Jonathan Whitney has a history of the following: Patient Active Problem List   Diagnosis Date Noted  . Mild intermittent asthma, uncomplicated 09/26/2019  . Seasonal and perennial allergic rhinitis 09/26/2019  . Reactive airway disease 07/22/2019  . Seasonal allergic rhinitis due to pollen 07/22/2019  . Vitamin D deficiency 07/22/2019  . Elevated LFTs 07/22/2019  . Dysthymia 07/10/2019  . Morbid obesity (HCC) 07/10/2019  . Healthcare maintenance 07/10/2019    History obtained from: chart review and patient.  09/09/2019 was referred by Jonathan Kin, MD.     Jonathan Whitney is a 34 y.o. male presenting for an evaluation of chronic allergies. He has been having nasal issues since April or May. Prior to this, he did not really have any issues. He never really suffered with allergies. He has been using Afrin since May 2021 for more than twice daily. He has been using the Flonase with the Afrin as much as he can. He has not been using cetirizine or Allegra or Benadryl. He has not been having itchy watery eyes. All of his symptoms  have been nasal. He does report some minimal postnasal drip with throat clearing. He has not been able to breathe. He grew up in [place] and moved to [place] in 2016. He just "needed a change of pace". He was living in [place].   Of note, there was some mold in his apartment recently that they are fixing. He is getting a new tub, so maybe they are actually addressing it instead of covering it up.  Mold  started earlier this year probably around March or April.  This might have been the trigger for his symptoms.   Asthma/Respiratory Symptom History: He does have asthma, but he does not have issues unless he has a cold. He does have an inhaler to use as needed. He had prednisone a couple of months ago at Urgent Care.  Jonathan Whitney's asthma has been well controlled. He has not required rescue medication, experienced nocturnal awakenings due to lower respiratory symptoms, nor have activities of daily living been limited. He has required no Emergency Department or Urgent Care visits for his asthma. He has required zero courses of systemic steroids for asthma exacerbations since the last visit. ACT score today is 25, indicating excellent asthma symptom control.   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    Otherwise, there is no history of other atopic diseases, including food allergies, drug allergies, stinging insect allergies, eczema, urticaria or contact dermatitis. There is no significant infectious history. Vaccinations are up to date.    Past Medical History: Patient Active Problem List   Diagnosis Date Noted  . Mild intermittent asthma, uncomplicated 09/26/2019  . Seasonal and perennial allergic rhinitis 09/26/2019  . Reactive airway disease 07/22/2019  . Seasonal allergic rhinitis due to pollen 07/22/2019  . Vitamin D deficiency 07/22/2019  . Elevated LFTs 07/22/2019  . Dysthymia 07/10/2019  . Morbid obesity (HCC) 07/10/2019  . Healthcare maintenance 07/10/2019    Medication List:  Allergies as of 09/26/2019   No Known Allergies     Medication List       Accurate as of September 26, 2019  9:11 AM. If you have any questions, ask your nurse or doctor.        STOP taking these medications   cetirizine 10 MG tablet Commonly known as: ZYRTEC Stopped by: Jonathan Spruce, MD   fluticasone 50 MCG/ACT nasal spray Commonly known as: FLONASE Replaced  by: Jonathan Whitney 93 MCG/ACT Exhu Stopped by: Jonathan Spruce, MD   mometasone 50 MCG/ACT nasal spray Commonly known as: Nasonex Stopped by: Jonathan Spruce, MD   predniSONE 20 MG tablet Commonly known as: DELTASONE Stopped by: Jonathan Spruce, MD   Vitamin D (Ergocalciferol) 1.25 MG (50000 UNIT) Caps capsule Commonly known as: DRISDOL Stopped by: Jonathan Spruce, MD     TAKE these medications   albuterol 108 (90 Base) MCG/ACT inhaler Commonly known as: VENTOLIN HFA Inhale 1-2 puffs into the lungs every 6 (six) hours as needed for wheezing or shortness of breath.   oxymetazoline 0.05 % nasal spray Commonly known as: AFRIN Place 1 spray into both nostrils 2 (two) times daily.   Xhance 93 MCG/ACT Exhu Generic drug: Fluticasone Propionate Place 2 sprays into both nostrils in the morning and at bedtime. Replaces: fluticasone 50 MCG/ACT nasal spray Started by: Jonathan Spruce, MD       Birth History: non-contributory  Developmental History: non-contributory  Past Surgical History: Past Surgical History:  Procedure Laterality  Date  . LEG SURGERY       Family History: Family History  Problem Relation Age of Onset  . Cancer Mother   . Allergic rhinitis Neg Hx   . Angioedema Neg Hx   . Asthma Neg Hx   . Atopy Neg Hx   . Eczema Neg Hx   . Immunodeficiency Neg Hx   . Urticaria Neg Hx      Social History: Rourke lives at home with his daughter (split custody between MurilloMom and Dad). He lives in an apartment. There is some carpeting but mostly wood flooring throughout. They have electric heating and central cooling. There are cats outside of the house. There are dust mite covers on the bed, but not the pillows. There is no tobacco exposure. He currently works as a Pharmacologistpharmacy technician for the past 3 years. He is not exposed to fumes, chemicals, or dust. He does not have a HEPA filter in the home. He does not live near an interstate or industrial  area.   Review of Systems  Constitutional: Negative.  Negative for chills, fever, malaise/fatigue and weight loss.  HENT: Positive for congestion. Negative for ear discharge, ear pain and sore throat.        Postnasal drip present.  Eyes: Negative for pain, discharge and redness.  Respiratory: Negative for cough, sputum production, shortness of breath and wheezing.   Cardiovascular: Negative.  Negative for chest pain and palpitations.  Gastrointestinal: Negative for abdominal pain, constipation, diarrhea, heartburn, nausea and vomiting.  Skin: Negative.  Negative for itching and rash.  Neurological: Negative for dizziness and headaches.  Endo/Heme/Allergies: Negative for environmental allergies. Does not bruise/bleed easily.       Objective:   Blood pressure 130/80, pulse (!) 104, resp. rate 18, height 6\' 1"  (1.854 m), weight 276 lb (125.2 kg), SpO2 97 %. Body mass index is 36.41 kg/m.   Physical Exam:   Physical Exam Constitutional:      Appearance: He is well-developed.     Comments: Pleasant male. Cooperative with the exam.   HENT:     Head: Normocephalic and atraumatic.     Right Ear: Tympanic membrane, ear canal and external ear normal. No drainage, swelling or tenderness. Tympanic membrane is not injected, scarred, erythematous, retracted or bulging.     Left Ear: Tympanic membrane, ear canal and external ear normal. No drainage, swelling or tenderness. Tympanic membrane is not injected, scarred, erythematous, retracted or bulging.     Nose: No nasal deformity, septal deviation, mucosal edema or rhinorrhea.     Right Turbinates: Enlarged, swollen and pale.     Left Turbinates: Enlarged, swollen and pale.     Right Sinus: No maxillary sinus tenderness or frontal sinus tenderness.     Left Sinus: No maxillary sinus tenderness or frontal sinus tenderness.     Comments: There was a white mass in the left nostril, but it resolved after he blew his nose. No septal deviation  that I could appreciate. There was copious mucous present.     Mouth/Throat:     Mouth: Mucous membranes are not pale and not dry.     Pharynx: Uvula midline.  Eyes:     General:        Right eye: No discharge.        Left eye: No discharge.     Conjunctiva/sclera: Conjunctivae normal.     Right eye: Right conjunctiva is not injected. No chemosis.    Left eye: Left conjunctiva is not  injected. No chemosis.    Pupils: Pupils are equal, round, and reactive to light.  Cardiovascular:     Rate and Rhythm: Normal rate and regular rhythm.     Heart sounds: Normal heart sounds.  Pulmonary:     Effort: Pulmonary effort is normal. No tachypnea, accessory muscle usage or respiratory distress.     Breath sounds: Normal breath sounds. No wheezing, rhonchi or rales.     Comments: Moving air well in all lung fields. No increased work of breathing noted.  Chest:     Chest wall: No tenderness.  Abdominal:     Tenderness: There is no abdominal tenderness. There is no guarding or rebound.  Lymphadenopathy:     Head:     Right side of head: No submandibular, tonsillar or occipital adenopathy.     Left side of head: No submandibular, tonsillar or occipital adenopathy.     Cervical: No cervical adenopathy.  Skin:    Coloration: Skin is not pale.     Findings: No abrasion, erythema, petechiae or rash. Rash is not papular, urticarial or vesicular.     Comments: No eczematous or urticarial lesions noted.   Neurological:     Mental Status: He is alert.  Psychiatric:        Behavior: Behavior is cooperative.      Diagnostic studies:   Allergy Studies:     Airborne Adult Perc - 09/26/19 0800    Time Antigen Placed 0848    Allergen Manufacturer Waynette Buttery    Location Back    Number of Test 59    1. Control-Buffer 50% Glycerol Negative    2. Control-Histamine 1 mg/ml 2+    3. Albumin saline Negative    4. Bahia Negative    5. French Southern Territories Negative    6. Johnson Negative    7. Kentucky Blue Negative     8. Meadow Fescue Negative    9. Perennial Rye Negative    10. Sweet Vernal Negative    11. Timothy Negative    12. Cocklebur Negative    13. Burweed Marshelder Negative    14. Ragweed, short --   +/-   15. Ragweed, Giant Negative    16. Plantain,  English Negative    17. Lamb's Quarters Negative    18. Sheep Sorrell Negative    19. Rough Pigweed Negative    20. Marsh Elder, Rough Negative    21. Mugwort, Common Negative    22. Ash mix Negative    23. Birch mix Negative    24. Beech American Negative    25. Box, Elder Negative    26. Cedar, red Negative    27. Cottonwood, Guinea-Bissau Negative    28. Elm mix Negative    29. Hickory Negative    30. Maple mix 3+    31. Oak, Guinea-[place]au mix Negative    32. Pecan Pollen Negative    33. Pine mix Negative    34. Sycamore Eastern Negative    35. Walnut, Black Pollen Negative    36. Alternaria alternata 2+    37. Cladosporium Herbarum 2+    38. Aspergillus mix 2+    39. Penicillium mix 2+    40. Bipolaris sorokiniana (Helminthosporium) Negative    41. Drechslera spicifera (Curvularia) Negative    42. Mucor plumbeus Negative    43. Fusarium moniliforme 2+    44. Aureobasidium pullulans (pullulara) 2+    45. Rhizopus oryzae Negative    46. Botrytis cinera 2+  47. Epicoccum nigrum 2+    48. Phoma betae 2+    49. Candida Albicans 2+    50. Trichophyton mentagrophytes 3+    51. Mite, D Farinae  5,000 AU/ml 2+    52. Mite, D Pteronyssinus  5,000 AU/ml Negative    53. Cat Hair 10,000 BAU/ml 3+    54.  Dog Epithelia 2+    55. Mixed Feathers 2+    56. Horse Epithelia 2+    57. Cockroach, German 2+    58. Mouse 3+    59. Tobacco Leaf Negative           Allergy testing results were read and interpreted by myself, documented by clinical staff.         Malachi Bonds, MD [facility]

## 2019-09-26 NOTE — Patient Instructions (Addendum)
1. Mild intermittent asthma, uncomplicated - Lung testing deferred since you seemed to be doing fairly well. - Continue with albuterol 4 puffs every 4-6 hours. - There is no need for a controller medication at this time.  2. Chronic rhinitis - Testing today showed: mourse, mixed feather, ragweed, trees, indoor molds, outdoor molds, dust mites, cat, dog and cockroach - Copy of test results provided.  - Avoidance measures provided. - Stop taking: Flonase - Continue with: Afrin (twice daily for TWO WEEKS, once daily for TWO WEEK, once every M/W/F for FOUR WEEKS, then STOP) - Start taking: Zyrtec (cetirizine) 10mg  tablet once daily, Xhance (fluticasone) 1-2 sprays per nostril daily and Astelin (azelastine) 2 sprays per nostril 1-2 times daily as needed  - We will send in the script to the [facility] outpatient pharmacy, and they will call you to confirm your shipping address. - You can review how to use the device here: https://www.xhance.com - Ask to be enrolled in the auto-refill program so you can get a year for free. - Consider nasal saline rinses 1-2 times daily to remove allergens from the nasal cavities as well as help with mucous clearance (this is especially helpful to do before the nasal sprays are given) - Consider allergy shots as a means of long-term control. - Allergy shots "re-train" and "reset" the immune system to ignore environmental allergens and decrease the resulting immune response to those allergens (sneezing, itchy watery eyes, runny nose, nasal congestion, etc).  - Allergy shots improve symptoms in 75-85% of patients.  - We can discuss more at the next appointment if the medications are not working for you.  3. Return in about 2 months (around 11/26/2019). This can be an in-person, a virtual Webex or a telephone follow up visit.   Please inform 11/28/2019 of any Emergency Department visits, hospitalizations, or changes in symptoms. Call us before going to the ED for breathing or  allergy symptoms since we might be able to fit you in for a sick visit. Feel free to contact us anytime with any questions, problems, or concerns.  It was a pleasure to meet you and your family today!  Websites that have reliable patient information: 1. American Academy of Asthma, Allergy, and Immunology: www.aaaai.org 2. Food Allergy Research and Education (FARE): foodallergy.org 3. Mothers of Asthmatics: http://www.asthmacommunitynetwork.org 4. American College of Allergy, Asthma, and Immunology: www.acaai.org   COVID-19 Vaccine Information can be found at: [place] For questions related to vaccine distribution or appointments, please email [email or web address] or call [phone].     "Like" [phone] on Facebook and Instagram for our latest updates!        Make sure you are registered to vote! If you have moved or changed any of your contact information, you will need to get this updated before voting!  In some cases, you MAY be able to register to vote online: [place]     Reducing Pollen Exposure  The American Academy of Allergy, Asthma and Immunology suggests the following steps to reduce your exposure to pollen during allergy seasons.    1. Do not hang sheets or clothing out to dry; pollen may collect on these items. 2. Do not mow lawns or spend time around freshly cut grass; mowing stirs up pollen. 3. Keep windows closed at night.  Keep car windows closed while driving. 4. Minimize morning activities outdoors, a time when pollen counts are usually at their highest. 5. Stay indoors as much as possible when pollen counts or humidity is high and on  windy days when pollen tends to remain in the air longer. 6. Use air conditioning when possible.  Many air conditioners have filters that trap the pollen spores. 7. Use a HEPA room air filter to remove pollen form the indoor  air you breathe.  Control of Mold Allergen   Mold and fungi can grow on a variety of surfaces provided certain temperature and moisture conditions exist.  Outdoor molds grow on plants, decaying vegetation and soil.  The major outdoor mold, Alternaria and Cladosporium, are found in very high numbers during hot and dry conditions.  Generally, a late Summer - Fall peak is seen for common outdoor fungal spores.  Rain will temporarily lower outdoor mold spore count, but counts rise rapidly when the rainy period ends.  The most important indoor molds are Aspergillus and Penicillium.  Dark, humid and poorly ventilated basements are ideal sites for mold growth.  The next most common sites of mold growth are the bathroom and the kitchen.  Outdoor (Seasonal) Mold Control  Positive outdoor molds via skin testing: Alternaria, Cladosporium and Epicoccum  1. Use air conditioning and keep windows closed 2. Avoid exposure to decaying vegetation. 3. Avoid leaf raking. 4. Avoid grain handling. 5. Consider wearing a face mask if working in moldy areas.  6.   Indoor (Perennial) Mold Control   Positive indoor molds via skin testing: Aspergillus, Penicillium, Fusarium, Aureobasidium (Pullulara), Botrytis, Phoma, Candida and Trichophyton  1. Maintain humidity below 50%. 2. Clean washable surfaces with 5% bleach solution. 3. Remove sources e.g. contaminated carpets.     Control of Dust Mite Allergen    Dust mites play a major role in allergic asthma and rhinitis.  They occur in environments with high humidity wherever human skin is found.  Dust mites absorb humidity from the atmosphere (ie, they do not drink) and feed on organic matter (including shed human and animal skin).  Dust mites are a microscopic type of insect that you cannot see with the naked eye.  High levels of dust mites have been detected from mattresses, pillows, carpets, upholstered furniture, bed covers, clothes, soft toys and any woven  material.  The principal allergen of the dust mite is found in its feces.  A gram of dust may contain 1,000 mites and 250,000 fecal particles.  Mite antigen is easily measured in the air during house cleaning activities.  Dust mites do not bite and do not cause harm to humans, other than by triggering allergies/asthma.    Ways to decrease your exposure to dust mites in your home:  1. Encase mattresses, box springs and pillows with a mite-impermeable barrier or cover   2. Wash sheets, blankets and drapes weekly in hot water (130 F) with detergent and dry them in a dryer on the hot setting.  3. Have the room cleaned frequently with a vacuum cleaner and a damp dust-mop.  For carpeting or rugs, vacuuming with a vacuum cleaner equipped with a high-efficiency particulate air (HEPA) filter.  The dust mite allergic individual should not be in a room which is being cleaned and should wait 1 hour after cleaning before going into the room. 4. Do not sleep on upholstered furniture (eg, couches).   5. If possible removing carpeting, upholstered furniture and drapery from the home is ideal.  Horizontal blinds should be eliminated in the rooms where the person spends the most time (bedroom, study, television room).  Washable vinyl, roller-type shades are optimal. 6. Remove all non-washable stuffed toys from the bedroom.  Wash stuffed toys weekly like sheets and blankets above.   7. Reduce indoor humidity to less than 50%.  Inexpensive humidity monitors can be purchased at most hardware stores.  Do not use a humidifier as can make the problem worse and are not recommended.  Control of Dog or Cat Allergen  Avoidance is the best way to manage a dog or cat allergy. If you have a dog or cat and are allergic to dog or cats, consider removing the dog or cat from the home. If you have a dog or cat but don't want to find it a new home, or if your family wants a pet even though someone in the household is allergic, here are  some strategies that may help keep symptoms at bay:  1. Keep the pet out of your bedroom and restrict it to only a few rooms. Be advised that keeping the dog or cat in only one room will not limit the allergens to that room. 2. Don't pet, hug or kiss the dog or cat; if you do, wash your hands with soap and water. 3. High-efficiency particulate air (HEPA) cleaners run continuously in a bedroom or living room can reduce allergen levels over time. 4. Regular use of a high-efficiency vacuum cleaner or a central vacuum can reduce allergen levels. 5. Giving your dog or cat a bath at least once a week can reduce airborne allergen.  Allergy Shots   Allergies are the result of a chain reaction that starts in the immune system. Your immune system controls how your body defends itself. For instance, if you have an allergy to pollen, your immune system identifies pollen as an invader or allergen. Your immune system overreacts by producing antibodies called Immunoglobulin E (IgE). These antibodies travel to cells that release chemicals, causing an allergic reaction.  The concept behind allergy immunotherapy, whether it is received in the form of shots or tablets, is that the immune system can be desensitized to specific allergens that trigger allergy symptoms. Although it requires time and patience, the payback can be long-term relief.  How Do Allergy Shots Work?  Allergy shots work much like a vaccine. Your body responds to injected amounts of a particular allergen given in increasing doses, eventually developing a resistance and tolerance to it. Allergy shots can lead to decreased, minimal or no allergy symptoms.  There generally are two phases: build-up and maintenance. Build-up often ranges from three to six months and involves receiving injections with increasing amounts of the allergens. The shots are typically given once or twice a week, though more rapid build-up schedules are sometimes used.  The  maintenance phase begins when the most effective dose is reached. This dose is different for each person, depending on how allergic you are and your response to the build-up injections. Once the maintenance dose is reached, there are longer periods between injections, typically two to four weeks.  Occasionally doctors give cortisone-type shots that can temporarily reduce allergy symptoms. These types of shots are different and should not be confused with allergy immunotherapy shots.  Who Can Be Treated with Allergy Shots?  Allergy shots may be a good treatment approach for people with allergic rhinitis (hay fever), allergic asthma, conjunctivitis (eye allergy) or stinging insect allergy.   Before deciding to begin allergy shots, you should consider:  . The length of allergy season and the severity of your symptoms . Whether medications and/or changes to your environment can control your symptoms . Your desire to avoid long-term  medication use . Time: allergy immunotherapy requires a major time commitment . Cost: may vary depending on your insurance coverage  Allergy shots for children age 60five and older are effective and often well tolerated. They might prevent the onset of new allergen sensitivities or the progression to asthma.  Allergy shots are not started on patients who are pregnant but can be continued on patients who become pregnant while receiving them. In some patients with other medical conditions or who take certain common medications, allergy shots may be of risk. It is important to mention other medications you talk to your allergist.   When Will I Feel Better?  Some may experience decreased allergy symptoms during the build-up phase. For others, it may take as long as 12 months on the maintenance dose. If there is no improvement after a year of maintenance, your allergist will discuss other treatment options with you.  If you aren't responding to allergy shots, it may be  because there is not enough dose of the allergen in your vaccine or there are missing allergens that were not identified during your allergy testing. Other reasons could be that there are high levels of the allergen in your environment or major exposure to non-allergic triggers like tobacco smoke.  What Is the Length of Treatment?  Once the maintenance dose is reached, allergy shots are generally continued for three to five years. The decision to stop should be discussed with your allergist at that time. Some people may experience a permanent reduction of allergy symptoms. Others may relapse and a longer course of allergy shots can be considered.  What Are the Possible Reactions?  The two types of adverse reactions that can occur with allergy shots are local and systemic. Common local reactions include very mild redness and swelling at the injection site, which can happen immediately or several hours after. A systemic reaction, which is less common, affects the entire body or a particular body system. They are usually mild and typically respond quickly to medications. Signs include increased allergy symptoms such as sneezing, a stuffy nose or hives.  Rarely, a serious systemic reaction called anaphylaxis can develop. Symptoms include swelling in the throat, wheezing, a feeling of tightness in the chest, nausea or dizziness. Most serious systemic reactions develop within 30 minutes of allergy shots. This is why it is strongly recommended you wait in your doctor's office for 30 minutes after your injections. Your allergist is trained to watch for reactions, and his or her staff is trained and equipped with the proper medications to identify and treat them.  Who Should Administer Allergy Shots?  The preferred location for receiving shots is your prescribing allergist's office. Injections can sometimes be given at another facility where the physician and staff are trained to recognize and treat reactions,  and have received instructions by your prescribing allergist.

## 2019-09-26 NOTE — Addendum Note (Signed)
Addended by: Mliss Fritz I on: 09/26/2019 09:21 AM   Modules accepted: Orders

## 2019-11-27 ENCOUNTER — Ambulatory Visit: Payer: No Typology Code available for payment source | Admitting: Allergy & Immunology

## 2020-02-18 ENCOUNTER — Other Ambulatory Visit: Payer: Self-pay

## 2020-02-18 ENCOUNTER — Emergency Department (HOSPITAL_COMMUNITY)
Admission: EM | Admit: 2020-02-18 | Discharge: 2020-02-18 | Disposition: A | Discharge status: Home / Self Care | Payer: No Typology Code available for payment source | Attending: Emergency Medicine | Admitting: Emergency Medicine

## 2020-02-18 DIAGNOSIS — S0501XA Injury of conjunctiva and corneal abrasion without foreign body, right eye, initial encounter: Secondary | ICD-10-CM

## 2020-02-18 DIAGNOSIS — J45909 Unspecified asthma, uncomplicated: Secondary | ICD-10-CM | POA: Insufficient documentation

## 2020-02-18 DIAGNOSIS — Z87891 Personal history of nicotine dependence: Secondary | ICD-10-CM | POA: Insufficient documentation

## 2020-02-18 DIAGNOSIS — X58XXXA Exposure to other specified factors, initial encounter: Secondary | ICD-10-CM | POA: Diagnosis not present

## 2020-02-18 MED ORDER — FLUORESCEIN SODIUM 1 MG OP STRP
1.0000 | ORAL_STRIP | Freq: Once | OPHTHALMIC | Status: AC
  Administered 2020-02-18: 1 via OPHTHALMIC
  Filled 2020-02-18: qty 1

## 2020-02-18 MED ORDER — TETRACAINE HCL 0.5 % OP SOLN
2.0000 [drp] | Freq: Once | OPHTHALMIC | Status: AC
  Administered 2020-02-18: 2 [drp] via OPHTHALMIC
  Filled 2020-02-18: qty 4

## 2020-02-18 MED ORDER — ERYTHROMYCIN 5 MG/GM OP OINT
TOPICAL_OINTMENT | Freq: Once | OPHTHALMIC | Status: AC
  Administered 2020-02-18: 1 via OPHTHALMIC
  Filled 2020-02-18: qty 3.5

## 2020-02-18 NOTE — ED Provider Notes (Signed)
[facility]-EMERGENCY DEPT Provider Note   CSN: [id number] Arrival date & time: 02/18/20  4332     History No chief complaint on file.   Jonathan Whitney is a 35 y.o. male with PMHx asthma and chronic rhinitis (currently on Clindamycin x 2 weeks) who presents to the ED today with complaint of atraumatic, gradual onset, constant, burning/redness to the right eye that began yesterday when pt woke up. Pt states that he googled his symptoms and saw that sinus pressure could cause pain to his eye so he took a psudafed without relief. He went to [facility] earlier today and they told him to come to the ED for further evaluation. Pt denies any FB sensation to his eye. Denies any blurry vision or double vision. He reports that the light seems to bother his eye significantly. He has also been having some watery tearing to the eye itself. Pt does not wear contacts or glasses. No other complains at this time.   The history is provided by the patient and medical records.       Past Medical History:  Diagnosis Date  . Angio-edema   . Asthma     Patient Active Problem List   Diagnosis Date Noted  . Mild intermittent asthma, uncomplicated 09/26/2019  . Seasonal and perennial allergic rhinitis 09/26/2019  . Reactive airway disease 07/22/2019  . Seasonal allergic rhinitis due to pollen 07/22/2019  . Vitamin D deficiency 07/22/2019  . Elevated LFTs 07/22/2019  . Dysthymia 07/10/2019  . Morbid obesity (HCC) 07/10/2019  . Healthcare maintenance 07/10/2019    Past Surgical History:  Procedure Laterality Date  . LEG SURGERY         Family History  Problem Relation Age of Onset  . Cancer Mother   . Allergic rhinitis Neg Hx   . Angioedema Neg Hx   . Asthma Neg Hx   . Atopy Neg Hx   . Eczema Neg Hx   . Immunodeficiency Neg Hx   . Urticaria Neg Hx     Social History   Tobacco Use  . Smoking status: Former Smoker    Packs/day: 0.50    Types: Cigarettes  . Smokeless  tobacco: Never Used  Vaping Use  . Vaping Use: Never used  Substance Use Topics  . Alcohol use: Not Currently    Comment: rarely  . Drug use: Yes    Types: Marijuana    Home Medications Prior to Admission medications   Medication Sig Start Date End Date Taking? Authorizing Provider  albuterol (VENTOLIN HFA) 108 (90 Base) MCG/ACT inhaler Inhale 1-2 puffs into the lungs every 6 (six) hours as needed for wheezing or shortness of breath. 07/22/19  Yes Mliss Sax, MD  clindamycin (CLEOCIN) 300 MG capsule Take 300 mg by mouth in the morning and at bedtime. Start date : 02/06/20 02/06/20  Yes [provider]  azelastine (ASTELIN) 0.1 % nasal spray Place 2 sprays into both nostrils 2 (two) times daily as needed for rhinitis. Patient not taking: No sig reported 09/26/19   Alfonse Spruce, MD  Fluticasone Propionate Timmothy Sours) 93 MCG/ACT EXHU Place 2 sprays into both nostrils in the morning and at bedtime. Patient not taking: No sig reported 09/26/19   Alfonse Spruce, MD    Allergies    Patient has no known allergies.  Review of Systems   Review of Systems  Constitutional: Negative for chills and fever.  Eyes: Positive for photophobia, pain, discharge and redness. Negative for itching and  visual disturbance.  All other systems reviewed and are negative.   Physical Exam Updated Vital Signs BP (!) 141/88 (BP Location: Left Arm)   Pulse (!) 106   Temp 98.5 F (36.9 C) (Oral)   Resp 16   Ht 6\' 1"  (1.854 m)   Wt 117.9 kg   SpO2 99%   BMI 34.30 kg/m   Physical Exam Vitals and nursing note reviewed.  Constitutional:      Appearance: He is not ill-appearing.     Comments: Sitting in darkened room due to light sensitivity  HENT:     Head: Normocephalic and atraumatic.  Eyes:     General:        Right eye: Discharge present.     Intraocular pressure: Right eye pressure is 19 mmHg.     Extraocular Movements: Extraocular movements intact.      Conjunctiva/sclera:     Right eye: Right conjunctiva is injected.     Pupils: Pupils are equal, round, and reactive to light.     Right eye: Pupil is not sluggish. Corneal abrasion and fluorescein uptake present. Seidel exam negative.     Left eye: Pupil is not sluggish.     Comments: Visual Acuity Bilateral Distance:20/40 (Pt stated that they wear reading glasses occasionally) R Distance:20/70 L Distance:20/50  Cardiovascular:     Rate and Rhythm: Normal rate and regular rhythm.  Pulmonary:     Effort: Pulmonary effort is normal.     Breath sounds: Normal breath sounds.  Skin:    General: Skin is warm and dry.     Coloration: Skin is not jaundiced.  Neurological:     Mental Status: He is alert.     ED Results / Procedures / Treatments   Labs (all labs ordered are listed, but only abnormal results are displayed) Labs Reviewed - No data to display  EKG None  Radiology No results found.  Procedures Procedures (including critical care time)  Medications Ordered in ED Medications  fluorescein ophthalmic strip 1 strip (has no administration in time range)  tetracaine (PONTOCAINE) 0.5 % ophthalmic solution 2 drop (has no administration in time range)  erythromycin ophthalmic ointment (has no administration in time range)    ED Course  I have reviewed the triage vital signs and the nursing notes.  Pertinent labs & imaging results that were available during my care of the patient were reviewed by me and considered in my medical decision making (see chart for details).    MDM Rules/Calculators/A&P                          35 year old male who presents to the ED today complaining of atraumatic right eye pain that began yesterday when he woke up out of his sleep. He states he is having pain, redness, drainage of watery tears to the eye itself. No blurry vision or double vision. Patient states sensitivity to the light as well. On arrival to the ED vitals are stable. Patient  is afebrile, nontachycardic and nontachypneic appears to be no acute distress. When I enter the room he is sitting in a darkened room. He has some watery drainage to his eye itself with conjunctival injection throughout, no limbic flush appreciated. He has pupils equal round and reactive to light. No pain with extraocular movements. No periorbital edema. Given eye pain will assess with fluorescein drops and obtain pressure. I have lower suspicion for acute angle-closure glaucoma given patient woke up with  the pain and was not in a darkened room when the pain began. The symptoms do not seem consistent with preseptal versus septal cellulitis at this time either. He is currently on antibiotics for chronic rhinitis for the past 2 weeks; doubt allergic reaction. Visual acuity 20/70 on right eye, 20/50 on left eye 20/40 bilaterally. Patient states he is able to see but the light is bothering him causing him to have some difficulty in visualizing the board. Patient does have positive fluorescein uptake on exam today. His pressure is normal at 19. Treat for corneal abrasion at this time with erythromycin ointment. Patient instructed to follow-up with ophthalmology, have given him information for same. He is in agreement with plan at this time and stable for discharge.   This note was prepared using Dragon voice recognition software and may include unintentional dictation errors due to the inherent limitations of voice recognition software.  Final Clinical Impression(s) / ED Diagnoses Final diagnoses:  Abrasion of right cornea, initial encounter    Rx / DC Orders ED Discharge Orders    None       Discharge Instructions     Please use antibiotic eye drops as indicated - place an inch of ointment into the right eye 4 times daily for the next 1 week Follow up with ophthalmologist Dr. Allena Katz for further evaluation of your eye pain You can also use OTC Artificial Tears as well for comfort  Return to the ED  for any worsening symptoms       Tanda Rockers, PA-C 02/18/20 2252    Derwood Kaplan, MD 02/18/20 2253

## 2020-02-18 NOTE — Discharge Instructions (Signed)
Please use antibiotic eye drops as indicated - place an inch of ointment into the right eye 4 times daily for the next 1 week Follow up with ophthalmologist Dr. Allena Katz for further evaluation of your eye pain You can also use OTC Artificial Tears as well for comfort  Return to the ED for any worsening symptoms

## 2020-02-18 NOTE — ED Triage Notes (Signed)
Patient presents with R eye redness since yesterday, states it is painful, denies itching, swelling or drainage. Denies trauma. Patient is on abx for sinus infection currently. Went to [facility] and they told him to come to the ED. Eye is painful in sunlight.

## 2020-05-07 ENCOUNTER — Other Ambulatory Visit: Payer: Self-pay

## 2020-05-07 ENCOUNTER — Encounter (HOSPITAL_BASED_OUTPATIENT_CLINIC_OR_DEPARTMENT_OTHER): Payer: Self-pay | Admitting: Otolaryngology

## 2020-05-12 NOTE — Progress Notes (Signed)
Voice message left for Jennifer at [facility] [place] requesting orders for patient's surgery on Monday 05/17/2020. 

## 2020-05-13 ENCOUNTER — Other Ambulatory Visit (HOSPITAL_COMMUNITY): Payer: No Typology Code available for payment source

## 2020-05-13 NOTE — Progress Notes (Signed)
Sent text message reminding pt to go for covid testing today.  

## 2020-05-13 NOTE — H&P (Addendum)
HPI:   Jonathan Whitney is a 35 y.o. male who presents as a return Patient.   Current problem: Follow-up sinusitis.  HPI: He completed the antibiotic and the prednisone and stop using the Afrin as soon as he left here last visit. He is now able to breathe through his nose. He is blowing a lot of yellow and green stuff out of his nose which was not coming out prior. Overall he feels better about 50%. He tolerated the medicines well but he had a little trouble taking the clindamycin 3 times daily just due to his work schedule. He feels that he got more improvement from the prednisone then from the antibiotic.  PMH/Meds/All/SocHx/FamHx/ROS:   Past Medical History:  Diagnosis Date  . Allergy  . Asthma   History reviewed. No pertinent surgical history.  No family history of bleeding disorders, wound healing problems or difficulty with anesthesia.   Social History   Socioeconomic History  . Marital status: Unknown  Spouse name: Not on file  . Number of children: Not on file  . Years of education: Not on file  . Highest education level: Not on file  Occupational History  . Not on file  Tobacco Use  . Smoking status: Never Smoker  . Smokeless tobacco: Never Used  Vaping Use  . Vaping Use: Never used  Substance and Sexual Activity  . Alcohol use: Not Currently  . Drug use: Not on file  . Sexual activity: Not on file  Other Topics Concern  . Not on file  Social History Narrative  . Not on file   Social Determinants of Health   Financial Resource Strain: Not on file  Food Insecurity: Not on file  Transportation Needs: Not on file  Physical Activity: Not on file  Stress: Not on file  Social Connections: Not on file  Housing Stability: Not on file   Current Outpatient Medications:  . albuterol 90 mcg/actuation inhaler, Inhale into the lungs., Disp: , Rfl:  . clindamycin (CLEOCIN) 300 MG capsule, Take 300 mg by mouth., Disp: , Rfl:  . oxymetazoline HCl (AFRIN, OXYMETAZOLINE,  NASL), by Nasal route., Disp: , Rfl:  . predniSONE (DELTASONE) 20 MG tablet, Take 3 times a day for 3 days, two times a day for 3 days, one a day for 3 days., Disp: 18 tablet, Rfl: 0   Physical Exam:   Healthy-appearing gentleman in no distress. Breathing and voice are clear. Oral cavity and pharynx are clear. Nasal cavities reveal some dried secretions bilaterally but the airways are patent. There is no polyps or exudate identified.  Independent Review of Additional Tests or Records:  CT scan reveals diffuse ethmoid disease bilaterally, and maxillary thickening bilaterally with infundibular obstruction worse on the left. This is improved compared to the one a few months ago but still consistent with ongoing chronic sinusitis.  Procedures:  none  Impression & Plans:  Symptomatic improvement but not resolution, radiographic improvement but not resolution. Options discussed included continued medical therapy versus surgical intervention. He would like to continue with medication for now. I think that is reasonable. We will do another round of prednisone and switch to Augmentin for the twice daily scheduling. Recheck in 1 month or sooner as needed.We discussed the importance of eating active or live culture yoghurt 2-3 times daily while taking the antibiotics. This can help avoid GI side effects.

## 2020-05-14 ENCOUNTER — Other Ambulatory Visit (HOSPITAL_COMMUNITY)
Admission: RE | Admit: 2020-05-14 | Discharge: 2020-05-14 | Disposition: A | Discharge status: Home / Self Care | Payer: No Typology Code available for payment source | Source: Ambulatory Visit | Attending: Otolaryngology | Admitting: Otolaryngology

## 2020-05-14 DIAGNOSIS — Z20822 Contact with and (suspected) exposure to covid-19: Secondary | ICD-10-CM | POA: Diagnosis not present

## 2020-05-14 DIAGNOSIS — Z01812 Encounter for preprocedural laboratory examination: Secondary | ICD-10-CM | POA: Diagnosis present

## 2020-05-14 LAB — SARS CORONAVIRUS 2 (TAT 6-24 HRS): SARS Coronavirus 2: NEGATIVE

## 2020-05-17 ENCOUNTER — Ambulatory Visit (HOSPITAL_BASED_OUTPATIENT_CLINIC_OR_DEPARTMENT_OTHER): Payer: No Typology Code available for payment source | Admitting: Certified Registered"

## 2020-05-17 ENCOUNTER — Encounter (HOSPITAL_BASED_OUTPATIENT_CLINIC_OR_DEPARTMENT_OTHER)
Admission: RE | Disposition: A | Discharge status: Home / Self Care | Payer: Self-pay | Source: Ambulatory Visit | Attending: Otolaryngology

## 2020-05-17 ENCOUNTER — Encounter (HOSPITAL_BASED_OUTPATIENT_CLINIC_OR_DEPARTMENT_OTHER): Payer: Self-pay | Admitting: Otolaryngology

## 2020-05-17 ENCOUNTER — Other Ambulatory Visit: Payer: Self-pay

## 2020-05-17 ENCOUNTER — Ambulatory Visit (HOSPITAL_BASED_OUTPATIENT_CLINIC_OR_DEPARTMENT_OTHER)
Admission: RE | Admit: 2020-05-17 | Discharge: 2020-05-17 | Disposition: A | Discharge status: Home / Self Care | Payer: No Typology Code available for payment source | Source: Ambulatory Visit | Attending: Otolaryngology | Admitting: Otolaryngology

## 2020-05-17 DIAGNOSIS — Z79899 Other long term (current) drug therapy: Secondary | ICD-10-CM | POA: Diagnosis not present

## 2020-05-17 DIAGNOSIS — J45909 Unspecified asthma, uncomplicated: Secondary | ICD-10-CM | POA: Insufficient documentation

## 2020-05-17 DIAGNOSIS — Z7951 Long term (current) use of inhaled steroids: Secondary | ICD-10-CM | POA: Insufficient documentation

## 2020-05-17 DIAGNOSIS — J324 Chronic pansinusitis: Secondary | ICD-10-CM | POA: Diagnosis present

## 2020-05-17 HISTORY — PX: NASAL SINUS SURGERY: SHX719

## 2020-05-17 SURGERY — SINUS SURGERY, ENDOSCOPIC
Anesthesia: General | Site: Nose | Laterality: Bilateral

## 2020-05-17 MED ORDER — SUGAMMADEX SODIUM 500 MG/5ML IV SOLN
INTRAVENOUS | Status: AC
  Filled 2020-05-17: qty 5

## 2020-05-17 MED ORDER — FENTANYL CITRATE (PF) 100 MCG/2ML IJ SOLN
INTRAMUSCULAR | Status: DC | PRN
  Administered 2020-05-17: 50 ug via INTRAVENOUS
  Administered 2020-05-17: 100 ug via INTRAVENOUS
  Administered 2020-05-17 (×2): 50 ug via INTRAVENOUS

## 2020-05-17 MED ORDER — SUGAMMADEX SODIUM 200 MG/2ML IV SOLN
INTRAVENOUS | Status: DC | PRN
  Administered 2020-05-17: 300 mg via INTRAVENOUS

## 2020-05-17 MED ORDER — MIDAZOLAM HCL 5 MG/5ML IJ SOLN
INTRAMUSCULAR | Status: DC | PRN
  Administered 2020-05-17: 2 mg via INTRAVENOUS

## 2020-05-17 MED ORDER — ROCURONIUM BROMIDE 100 MG/10ML IV SOLN
INTRAVENOUS | Status: DC | PRN
  Administered 2020-05-17: 100 mg via INTRAVENOUS

## 2020-05-17 MED ORDER — FENTANYL CITRATE (PF) 100 MCG/2ML IJ SOLN
INTRAMUSCULAR | Status: AC
  Filled 2020-05-17: qty 2

## 2020-05-17 MED ORDER — HYDROCODONE-ACETAMINOPHEN 7.5-325 MG PO TABS: 1.0000 | ORAL_TABLET | Freq: Four times a day (QID) | ORAL | 0 refills | Status: DC | PRN

## 2020-05-17 MED ORDER — PROMETHAZINE HCL 25 MG/ML IJ SOLN: 6.2500 mg | INTRAMUSCULAR | Status: DC | PRN

## 2020-05-17 MED ORDER — OXYCODONE HCL 5 MG PO TABS
ORAL_TABLET | ORAL | Status: AC
  Filled 2020-05-17: qty 1

## 2020-05-17 MED ORDER — PROPOFOL 10 MG/ML IV BOLUS
INTRAVENOUS | Status: DC | PRN
  Administered 2020-05-17: 200 mg via INTRAVENOUS

## 2020-05-17 MED ORDER — ARTIFICIAL TEARS OPHTHALMIC OINT
TOPICAL_OINTMENT | OPHTHALMIC | Status: AC
  Filled 2020-05-17: qty 3.5

## 2020-05-17 MED ORDER — OXYMETAZOLINE HCL 0.05 % NA SOLN
NASAL | Status: AC
  Filled 2020-05-17: qty 30

## 2020-05-17 MED ORDER — OXYMETAZOLINE HCL 0.05 % NA SOLN
2.0000 | NASAL | Status: DC
  Administered 2020-05-17: 2 via NASAL

## 2020-05-17 MED ORDER — CEPHALEXIN 500 MG PO CAPS: 500.0000 mg | ORAL_CAPSULE | Freq: Three times a day (TID) | ORAL | 0 refills | Status: DC

## 2020-05-17 MED ORDER — LABETALOL HCL 5 MG/ML IV SOLN
INTRAVENOUS | Status: AC
  Filled 2020-05-17: qty 4

## 2020-05-17 MED ORDER — OXYCODONE HCL 5 MG PO TABS
5.0000 mg | ORAL_TABLET | Freq: Once | ORAL | Status: AC | PRN
  Administered 2020-05-17: 5 mg via ORAL

## 2020-05-17 MED ORDER — LIDOCAINE-EPINEPHRINE 1 %-1:100000 IJ SOLN
INTRAMUSCULAR | Status: DC | PRN
  Administered 2020-05-17: 8 mL

## 2020-05-17 MED ORDER — DEXAMETHASONE SODIUM PHOSPHATE 10 MG/ML IJ SOLN
INTRAMUSCULAR | Status: DC | PRN
  Administered 2020-05-17: 10 mg via INTRAVENOUS

## 2020-05-17 MED ORDER — LIDOCAINE-EPINEPHRINE 1 %-1:100000 IJ SOLN
INTRAMUSCULAR | Status: AC
  Filled 2020-05-17: qty 1

## 2020-05-17 MED ORDER — OXYMETAZOLINE HCL 0.05 % NA SOLN
NASAL | Status: DC | PRN
  Administered 2020-05-17: 1 via TOPICAL

## 2020-05-17 MED ORDER — BACITRACIN 500 UNIT/GM EX OINT
TOPICAL_OINTMENT | CUTANEOUS | Status: DC | PRN
  Administered 2020-05-17: 1 via TOPICAL

## 2020-05-17 MED ORDER — HYDROMORPHONE HCL 1 MG/ML IJ SOLN: 0.2500 mg | INTRAMUSCULAR | Status: DC | PRN

## 2020-05-17 MED ORDER — LACTATED RINGERS IV SOLN: INTRAVENOUS | Status: DC

## 2020-05-17 MED ORDER — ARTIFICIAL TEARS OPHTHALMIC OINT
TOPICAL_OINTMENT | OPHTHALMIC | Status: DC | PRN
  Administered 2020-05-17: 1 via OPHTHALMIC

## 2020-05-17 MED ORDER — PROMETHAZINE HCL 25 MG RE SUPP: 25.0000 mg | Freq: Four times a day (QID) | RECTAL | 1 refills | Status: DC | PRN

## 2020-05-17 MED ORDER — BACITRACIN ZINC 500 UNIT/GM EX OINT
TOPICAL_OINTMENT | CUTANEOUS | Status: AC
  Filled 2020-05-17: qty 28.35

## 2020-05-17 MED ORDER — LABETALOL HCL 5 MG/ML IV SOLN
INTRAVENOUS | Status: DC | PRN
  Administered 2020-05-17: 5 mg via INTRAVENOUS

## 2020-05-17 MED ORDER — LIDOCAINE 2% (20 MG/ML) 5 ML SYRINGE
INTRAMUSCULAR | Status: DC | PRN
  Administered 2020-05-17: 100 mg via INTRAVENOUS

## 2020-05-17 MED ORDER — AMISULPRIDE (ANTIEMETIC) 5 MG/2ML IV SOLN: 10.0000 mg | Freq: Once | INTRAVENOUS | Status: DC | PRN

## 2020-05-17 MED ORDER — ONDANSETRON HCL 4 MG/2ML IJ SOLN
INTRAMUSCULAR | Status: DC | PRN
  Administered 2020-05-17: 4 mg via INTRAVENOUS

## 2020-05-17 MED ORDER — OXYCODONE HCL 5 MG/5ML PO SOLN
5.0000 mg | Freq: Once | ORAL | Status: AC | PRN
Start: 2020-05-17 — End: 2020-05-17

## 2020-05-17 MED ORDER — PROPOFOL 10 MG/ML IV BOLUS
INTRAVENOUS | Status: AC
  Filled 2020-05-17: qty 40

## 2020-05-17 MED ORDER — MIDAZOLAM HCL 2 MG/2ML IJ SOLN
INTRAMUSCULAR | Status: AC
  Filled 2020-05-17: qty 2

## 2020-05-17 MED ORDER — MEPERIDINE HCL 25 MG/ML IJ SOLN: 6.2500 mg | INTRAMUSCULAR | Status: DC | PRN

## 2020-05-17 SURGICAL SUPPLY — 42 items
ATTRACTOMAT 16X20 MAGNETIC DRP (DRAPES) IMPLANT
BLADE RAD40 ROTATE 4M 4 5PK (BLADE) IMPLANT
BLADE RAD60 ROTATE M4 4 5PK (BLADE) IMPLANT
BLADE TRICUT ROTATE M4 4 5PK (BLADE) ×2 IMPLANT
BNDG COHESIVE 3X5 TAN STRL LF (GAUZE/BANDAGES/DRESSINGS) ×2 IMPLANT
BUR HS RAD FRONTAL 3 (BURR) IMPLANT
CANISTER SUC SOCK COL 7IN (MISCELLANEOUS) ×2 IMPLANT
CANISTER SUCT 1200ML W/VALVE (MISCELLANEOUS) ×4 IMPLANT
CORD BIPOLAR FORCEPS 12FT (ELECTRODE) IMPLANT
COVER WAND RF STERILE (DRAPES) IMPLANT
DECANTER SPIKE VIAL GLASS SM (MISCELLANEOUS) ×2 IMPLANT
DRESSING NASAL KENNEDY 3.5X.9 (MISCELLANEOUS) IMPLANT
DRSG CURAD 3X16 NADH (PACKING) IMPLANT
DRSG NASAL KENNEDY 3.5X.9 (MISCELLANEOUS)
DRSG NASAL KENNEDY LMNT 8CM (GAUZE/BANDAGES/DRESSINGS) IMPLANT
DRSG NASOPORE 8CM (GAUZE/BANDAGES/DRESSINGS) ×2 IMPLANT
DRSG TELFA 3X8 NADH (GAUZE/BANDAGES/DRESSINGS) ×2 IMPLANT
FORCEPS BIPOLAR SPETZLER 8 1.0 (NEUROSURGERY SUPPLIES) IMPLANT
GAUZE 4X4 16PLY RFD (DISPOSABLE) IMPLANT
GAUZE VASELINE FOILPK 1/2 X 72 (GAUZE/BANDAGES/DRESSINGS) IMPLANT
GLOVE SURG ENC MOIS LTX SZ6.5 (GLOVE) ×4 IMPLANT
GLOVE SURG LTX SZ7.5 (GLOVE) ×2 IMPLANT
GLOVE SURG UNDER POLY LF SZ7 (GLOVE) ×4 IMPLANT
GOWN STRL REUS W/ TWL LRG LVL3 (GOWN DISPOSABLE) ×2 IMPLANT
GOWN STRL REUS W/TWL LRG LVL3 (GOWN DISPOSABLE) ×4
GOWN STRL REUS W/TWL XL LVL3 (GOWN DISPOSABLE) ×2 IMPLANT
HEMOSTAT SURGICEL .5X2 ABSORB (HEMOSTASIS) IMPLANT
IV NS 500ML (IV SOLUTION) ×2
IV NS 500ML BAXH (IV SOLUTION) ×1 IMPLANT
NEEDLE PRECISIONGLIDE 27X1.5 (NEEDLE) ×2 IMPLANT
NEEDLE SPNL 25GX3.5 QUINCKE BL (NEEDLE) IMPLANT
NS IRRIG 1000ML POUR BTL (IV SOLUTION) ×2 IMPLANT
PACK BASIN DAY SURGERY FS (CUSTOM PROCEDURE TRAY) ×2 IMPLANT
PACK ENT DAY SURGERY (CUSTOM PROCEDURE TRAY) ×2 IMPLANT
PATTIES SURGICAL .5 X3 (DISPOSABLE) ×2 IMPLANT
SLEEVE SCD COMPRESS KNEE MED (STOCKING) ×2 IMPLANT
SOLUTION BUTLER CLEAR DIP (MISCELLANEOUS) ×2 IMPLANT
SPONGE GAUZE 2X2 8PLY STRL LF (GAUZE/BANDAGES/DRESSINGS) ×2 IMPLANT
SPONGE SURGIFOAM ABS GEL 12-7 (HEMOSTASIS) IMPLANT
TOWEL GREEN STERILE FF (TOWEL DISPOSABLE) ×2 IMPLANT
TUBE CONNECTING 20X1/4 (TUBING) ×2 IMPLANT
YANKAUER SUCT BULB TIP NO VENT (SUCTIONS) ×2 IMPLANT

## 2020-05-17 NOTE — Interval H&P Note (Signed)
History and Physical Interval Note:  05/17/2020 8:15 AM  Jonathan Whitney  has presented today for surgery, with the diagnosis of Chronic Pansinusitis.  The various methods of treatment have been discussed with the patient and family. After consideration of risks, benefits and other options for treatment, the patient has consented to  Procedure(s): ENDOSCOPIC SINUS SURGERY (Bilateral) as a surgical intervention.  The patient's history has been reviewed, patient examined, no change in status, stable for surgery.  I have reviewed the patient's chart and labs.  Questions were answered to the patient's satisfaction.     Serena Colonel

## 2020-05-17 NOTE — Transfer of Care (Signed)
Immediate Anesthesia Transfer of Care Note  Patient: Jonathan Whitney  Procedure(s) Performed: ENDOSCOPIC SINUS SURGERY (Bilateral Nose)  Patient Location: PACU  Anesthesia Type:General  Level of Consciousness: drowsy  Airway & Oxygen Therapy: Patient Spontanous Breathing and Patient connected to face mask oxygen  Post-op Assessment: Report given to RN and Post -op Vital signs reviewed and stable  Post vital signs: Reviewed and stable  Last Vitals:  Vitals Value Taken Time  BP 142/87 05/17/20 1001  Temp    Pulse 89 05/17/20 1002  Resp 19 05/17/20 1002  SpO2 99 % 05/17/20 1002  Vitals shown include unvalidated device data.  Last Pain:  Vitals:   05/17/20 0734  TempSrc: Oral  PainSc: 0-No pain         Complications: No complications documented.

## 2020-05-17 NOTE — Op Note (Signed)
OPERATIVE REPORT  DATE OF SURGERY: 05/17/2020  PATIENT:  Jonathan Whitney,  35 y.o. male  PRE-OPERATIVE DIAGNOSIS:  Chronic Pansinusitis  POST-OPERATIVE DIAGNOSIS:  Chronic Pansinusitis  PROCEDURE:  Procedure(s): ENDOSCOPIC SINUS SURGERY, bilateral endoscopic total ethmoidectomy and bilateral endoscopic maxillary antrostomy  SURGEON:  Susy Frizzle, MD  ASSISTANTS: None  ANESTHESIA:   General   EBL: 250 ml  DRAINS: None  LOCAL MEDICATIONS USED: 1% Xylocaine with epinephrine  SPECIMEN: Bilateral nasal and sinus contents  COUNTS:  Correct  PROCEDURE DETAILS: The patient was taken to the operating room and placed on the operating table in the supine position. Following induction of general endotracheal anesthesia, the face was prepped and draped in a standard fashion.  Afrin spray was used preoperatively in the nasal cavities.  Afrin pledgets were used periodically throughout the case.  The nasal cavities were extremely narrow.  Xylocaine with epinephrine was infiltrated into the inferior turbinates, the septum, the lateral nasal wall, and the superior and posterior attachments of the middle turbinates.  1.  Bilateral endoscopic total ethmoidectomy.  Starting on the left side the infundibulum was inspected with a 0 degree endoscope.  The microdebrider was used to open the ethmoid bulla and to continue back into the ethmoid complex.  The greater lamella was taken down exposing posterior cells.  The lamina papyracea was kept intact.  The fovea was kept intact.  I was not able to perform evaluation of the frontal recess.  The anatomy made it too difficult to allow instrumentation in that area.  0 and 30 degree endoscopes were used.  There is multiple polypoid areas throughout the ethmoid complex.  Similar procedure was performed on the right.  The anatomy on the right was even more difficult due to the narrowness of the nasal cavity and the infundibulum.  A similar ethmoid dissection was  accomplished.  Polypoid disease was present in multiple cells as well.  Half of a NasalPore dressing was placed on each side at the termination.  2.  Bilateral endoscopic maxillary antrostomy.  After the initial anterior ethmoid dissection was accomplished a curved suction and 30 degree scope was used to open into the maxillary antrum on both sides.  Backbiting forcep was used to enlarge the antrostomy anteriorly and at least on the left side a through cut forcep was used posteriorly.  Due to some persistent oozing bilateral nasal packs were placed with rolled up Telfa coated with bacitracin.  Pharynx was suctioned blood and secretions.  Patient was awakened extubated and transferred to recovery in stable condition.    PATIENT DISPOSITION:  To PACU, stable

## 2020-05-17 NOTE — Anesthesia Procedure Notes (Signed)
Procedure Name: Intubation Date/Time: 05/17/2020 8:42 AM Performed by: Lauralyn Primes, CRNA Pre-anesthesia Checklist: Patient identified, Emergency Drugs available, Suction available and Patient being monitored Patient Re-evaluated:Patient Re-evaluated prior to induction Oxygen Delivery Method: Circle system utilized Preoxygenation: Pre-oxygenation with 100% oxygen Induction Type: IV induction Ventilation: Oral airway inserted - appropriate to patient size and Two handed mask ventilation required Laryngoscope Size: Glidescope and 4 Grade View: Grade I Tube type: Oral Rae Tube size: 8.0 mm Number of attempts: 1 Airway Equipment and Method: Stylet and Oral airway Placement Confirmation: ETT inserted through vocal cords under direct vision,  positive ETCO2 and breath sounds checked- equal and bilateral Tube secured with: Tape Dental Injury: Teeth and Oropharynx as per pre-operative assessment  Comments: DL x1 by CRNA with view of epiglottis only; DL x1 by MD, placement attempted with bougie, noted to be esophageal. ETT removed and patient mask ventilated with 2 providers. Glidescope 4 used for successful placement with grade I view.

## 2020-05-17 NOTE — Discharge Instructions (Signed)
Avoid any strenuous activity.  Keep your head elevated even when lying down.   Post Anesthesia Home Care Instructions  Activity: Get plenty of rest for the remainder of the day. A responsible individual must stay with you for 24 hours following the procedure.  For the next 24 hours, DO NOT: -Drive a car -Advertising copywriter -Drink alcoholic beverages -Take any medication unless instructed by your physician -Make any legal decisions or sign important papers.  Meals: Start with liquid foods such as gelatin or soup. Progress to regular foods as tolerated. Avoid greasy, spicy, heavy foods. If nausea and/or vomiting occur, drink only clear liquids until the nausea and/or vomiting subsides. Call your physician if vomiting continues.  Special Instructions/Symptoms: Your throat may feel dry or sore from the anesthesia or the breathing tube placed in your throat during surgery. If this causes discomfort, gargle with warm salt water. The discomfort should disappear within 24 hours.  If you had a scopolamine patch placed behind your ear for the management of post- operative nausea and/or vomiting:  1. The medication in the patch is effective for 72 hours, after which it should be removed.  Wrap patch in a tissue and discard in the trash. Wash hands thoroughly with soap and water. 2. You may remove the patch earlier than 72 hours if you experience unpleasant side effects which may include dry mouth, dizziness or visual disturbances. 3. Avoid touching the patch. Wash your hands with soap and water after contact with the patch.

## 2020-05-17 NOTE — Anesthesia Postprocedure Evaluation (Signed)
Anesthesia Post Note  Patient: Jonathan Whitney  Procedure(s) Performed: ENDOSCOPIC SINUS SURGERY (Bilateral Nose)     Patient location during evaluation: PACU Anesthesia Type: General Level of consciousness: awake and alert Pain management: pain level controlled Vital Signs Assessment: post-procedure vital signs reviewed and stable Respiratory status: spontaneous breathing, nonlabored ventilation and respiratory function stable Cardiovascular status: blood pressure returned to baseline and stable Postop Assessment: no apparent nausea or vomiting Anesthetic complications: no   No complications documented.  Last Vitals:  Vitals:   05/17/20 1030 05/17/20 1045  BP: (!) 160/99 (!) 145/83  Pulse: 80 75  Resp: 16 18  Temp:  37 C  SpO2: 95% 100%    Last Pain:  Vitals:   05/17/20 1045  TempSrc:   PainSc: 4                  Lowella Curb

## 2020-05-17 NOTE — Anesthesia Preprocedure Evaluation (Signed)
Anesthesia Evaluation  Patient identified by MRN, date of birth, ID band Patient awake    Reviewed: Allergy & Precautions, NPO status , Patient's Chart, lab work & pertinent test results  Airway Mallampati: II  TM Distance: >3 FB Neck ROM: Full    Dental no notable dental hx.    Pulmonary asthma , Patient abstained from smoking., former smoker,    Pulmonary exam normal breath sounds clear to auscultation       Cardiovascular negative cardio ROS Normal cardiovascular exam Rhythm:Regular Rate:Normal     Neuro/Psych Depression negative neurological ROS  negative psych ROS   GI/Hepatic negative GI ROS, Neg liver ROS,   Endo/Other  negative endocrine ROS  Renal/GU negative Renal ROS  negative genitourinary   Musculoskeletal negative musculoskeletal ROS (+)   Abdominal (+) + obese,   Peds negative pediatric ROS (+)  Hematology negative hematology ROS (+)   Anesthesia Other Findings   Reproductive/Obstetrics negative OB ROS                             Anesthesia Physical Anesthesia Plan  ASA: II  Anesthesia Plan: General   Post-op Pain Management:    Induction: Intravenous  PONV Risk Score and Plan: 2 and Ondansetron, Midazolam and Treatment may vary due to age or medical condition  Airway Management Planned: Oral ETT  Additional Equipment:   Intra-op Plan:   Post-operative Plan: Extubation in OR  Informed Consent: I have reviewed the patients History and Physical, chart, labs and discussed the procedure including the risks, benefits and alternatives for the proposed anesthesia with the patient or authorized representative who has indicated his/her understanding and acceptance.     Dental advisory given  Plan Discussed with: CRNA  Anesthesia Plan Comments:         Anesthesia Quick Evaluation

## 2020-05-18 ENCOUNTER — Encounter (HOSPITAL_BASED_OUTPATIENT_CLINIC_OR_DEPARTMENT_OTHER): Payer: Self-pay | Admitting: Otolaryngology

## 2020-05-20 LAB — SURGICAL PATHOLOGY

## 2020-10-15 ENCOUNTER — Telehealth: Payer: Self-pay

## 2020-10-15 NOTE — Telephone Encounter (Signed)
I received the fax from Dr. Pollyann Kennedy today as well. It looks like Dr. Ishmael Holter recommended Dupixent and allergen immunotherapy. Seferino needs a follow up to discuss the next steps.   Malachi Bonds, MD [facility]

## 2020-10-15 NOTE — Telephone Encounter (Signed)
Patient called stating Dr Pollyann Kennedy has requested he come back to be seen for the following reason:  CT shows diffuse inflammatory changes. I would like him to go back to the allergist and see about immunotherapy and possible Dupixent therapy.  Patient had a endoscopic sinus surgery Bilateral: Nose on 05/17/2020.  Please review care everywhere.   Thanks

## 2020-10-18 NOTE — Telephone Encounter (Signed)
Patient has been scheduled for tomorrow @ 2.  Thanks

## 2020-10-19 ENCOUNTER — Ambulatory Visit (INDEPENDENT_AMBULATORY_CARE_PROVIDER_SITE_OTHER): Payer: No Typology Code available for payment source | Admitting: Allergy & Immunology

## 2020-10-19 ENCOUNTER — Encounter: Payer: Self-pay | Admitting: Allergy & Immunology

## 2020-10-19 ENCOUNTER — Other Ambulatory Visit: Payer: Self-pay

## 2020-10-19 ENCOUNTER — Ambulatory Visit: Payer: No Typology Code available for payment source | Admitting: Allergy & Immunology

## 2020-10-19 VITALS — BP 140/86 | HR 98 | Temp 98.7°F | Resp 18 | Ht 73.0 in | Wt 280.4 lb

## 2020-10-19 DIAGNOSIS — J339 Nasal polyp, unspecified: Secondary | ICD-10-CM

## 2020-10-19 DIAGNOSIS — J3089 Other allergic rhinitis: Secondary | ICD-10-CM | POA: Diagnosis not present

## 2020-10-19 DIAGNOSIS — J302 Other seasonal allergic rhinitis: Secondary | ICD-10-CM

## 2020-10-19 DIAGNOSIS — J331 Polypoid sinus degeneration: Secondary | ICD-10-CM | POA: Diagnosis not present

## 2020-10-19 DIAGNOSIS — J4520 Mild intermittent asthma, uncomplicated: Secondary | ICD-10-CM

## 2020-10-19 NOTE — Patient Instructions (Addendum)
1. Mild intermittent asthma, uncomplicated - Lung testing deferred since you seemed to be doing fairly well. - Continue with albuterol 4 puffs every 4-6 hours. - There is no need for a controller medication at this time.  2. Chronic rhinitis (mourse, mixed feather, ragweed, trees, indoor molds, outdoor molds, dust mites, cat, dog and cockroach) - with overlying nasal polyps - Sample of Dupixent given today. - We are going to work on getting this approved. - Continue with the Xhance one spray per nostril twice daily (use every day in case this is going to be a sticking point for [name]). - Tammy will reach out to discuss the process (she is our Musician based in [place]).   3. Return in about 3 months (around 01/18/2021).    Please inform us of any Emergency Department visits, hospitalizations, or changes in symptoms. Call us before going to the ED for breathing or allergy symptoms since we might be able to fit you in for a sick visit. Feel free to contact us anytime with any questions, problems, or concerns.  It was a pleasure to see you again today!  Websites that have reliable patient information: 1. American Academy of Asthma, Allergy, and Immunology: www.aaaai.org 2. Food Allergy Research and Education (FARE): foodallergy.org 3. Mothers of Asthmatics: http://www.asthmacommunitynetwork.org 4. American College of Allergy, Asthma, and Immunology: www.acaai.org   COVID-19 Vaccine Information can be found at: [email or web address] For questions related to vaccine distribution or appointments, please email [email or web address] or call [phone].   We realize that you might be concerned about having an allergic reaction to the COVID19 vaccines. To help with that concern, WE ARE OFFERING THE COVID19 VACCINES IN OUR OFFICE! Ask the front desk for dates!     "Like" [place] on Facebook and Instagram for our  latest updates!      A healthy democracy works best when [employer] participate! Make sure you are registered to vote! If you have moved or changed any of your contact information, you will need to get this updated before voting!  In some cases, you MAY be able to register to vote online: [email or web address]

## 2020-10-19 NOTE — Progress Notes (Addendum)
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 Serena Colonel) - failed fluticasone, rhinocort, Xhance, and azelastine  Recurrent prednisone courses (4-6 in the last 12 years)  Plan/Recommendations:   1. Mild intermittent asthma, uncomplicated - Lung testing deferred since you seemed to be doing fairly well. - Continue with albuterol 4 puffs every 4-6 hours. - There is no need for a controller medication at this time.  2. Chronic rhinitis (mouse, mixed feather, ragweed, trees, indoor molds, outdoor molds, dust mites, cat, dog and cockroach) - with overlying nasal polyps - Sample of Dupixent given today. - We are going to work on getting this approved. - Continue with the Xhance one spray per nostril twice daily (use every day in case this is going to be a sticking point for [name]). - Tammy will reach out to discuss the process (she is our Musician based in [place]).   3. Return in about 3 months (around 01/18/2021).    Subjective:   Jonathan Whitney is a 35 y.o. male presenting today for follow up of  Chief Complaint  Patient presents with   Other    Jonathan Whitney has a history of the following: Patient Active Problem List   Diagnosis Date Noted   Polypoid sinus degeneration 10/20/2020   Mild intermittent asthma, uncomplicated 09/26/2019   Seasonal and perennial allergic rhinitis 09/26/2019   Reactive airway disease 07/22/2019   Seasonal allergic rhinitis due to pollen 07/22/2019   Vitamin D deficiency 07/22/2019   Elevated LFTs 07/22/2019   Dysthymia 07/10/2019   Morbid obesity (HCC) 07/10/2019   Healthcare maintenance 07/10/2019     History obtained from: chart review and patient.  Jonathan Whitney is a 35 y.o. male presenting for a follow up visit.  He was last seen as a new patient in August 2021.  At that time, lung testing was deferred since he was doing fairly well.  We continued with albuterol as needed.  He underwent testing that was positive to multiple indoor and outdoor allergens.  We stopped the Flonase and continued good Afrin on a weaning schedule.  We started Zyrtec 10 mg daily as well as Xhance and Astelin.  We did discuss allergen immunotherapy.  Asked him to follow-up in 3 months and he presents now over a year later.  In the interim, he actually had nasal surgery done by Dr. Pollyann Kennedy in April 2022.  He describes this as a polypectomy with turbinate reduction.  It helped a little but not the time.  He has had 4-6 rounds of prednisone since last time I saw him due to this sinusitis and nasal polyps.  Prednisone does help quite a bit, but symptoms resumed quickly after stopping the steroids.  He does report a lot of sinus pressure.  He has had a lot of weight gain from the multiple rounds of prednisone.  Dr. Pollyann Kennedy did not feel like additional surgery was indicated.  He recommended coming back to [place] for evaluation of possible allergy shots and/or Dupixent treatment.  Myson's asthma has been well controlled. He has not required rescue medication, experienced nocturnal awakenings due to lower respiratory symptoms, nor have activities of daily living been limited. He has required no Emergency Department or Urgent  Care visits for his asthma. He has required zero courses of systemic steroids for asthma exacerbations since the last visit. ACT score today is 25, indicating excellent asthma symptom control.    Otherwise, there have been no changes to his past medical history, surgical history, family history, or social history.    Review of Systems  Constitutional: Negative.  Negative for chills, fever, malaise/fatigue and  weight loss.  HENT:  Positive for congestion and sinus pain. Negative for ear discharge and ear pain.   Eyes:  Negative for pain, discharge and redness.  Respiratory:  Negative for cough, sputum production, shortness of breath and wheezing.   Cardiovascular: Negative.  Negative for chest pain and palpitations.  Gastrointestinal:  Negative for abdominal pain, constipation, diarrhea, heartburn, nausea and vomiting.  Skin: Negative.  Negative for itching and rash.  Neurological:  Negative for dizziness and headaches.  Endo/Heme/Allergies:  Positive for environmental allergies. Does not bruise/bleed easily.      Objective:   Blood pressure 140/86, pulse 98, temperature 98.7 F (37.1 C), temperature source Temporal, resp. rate 18, height 6\' 1"  (1.854 m), weight 280 lb 6 oz (127.2 kg), SpO2 97 %. Body mass index is 36.99 kg/m.   Physical Exam:  Physical Exam Vitals reviewed.  Constitutional:      Appearance: He is well-developed.  HENT:     Head: Normocephalic and atraumatic.     Right Ear: Tympanic membrane, ear canal and external ear normal.     Left Ear: Tympanic membrane, ear canal and external ear normal.     Nose: Mucosal edema and rhinorrhea present. No nasal deformity, septal deviation or laceration.     Right Turbinates: Enlarged, swollen and pale.     Left Turbinates: Enlarged, swollen and pale.     Right Sinus: No maxillary sinus tenderness or frontal sinus tenderness.     Left Sinus: No maxillary sinus tenderness or frontal sinus tenderness.     Comments: Polypoid sinus degeneration bilaterally.    Mouth/Throat:     Mouth: Mucous membranes are not pale and not dry.     Pharynx: Uvula midline.  Eyes:     General: Lids are normal. No allergic shiner.       Right eye: No discharge.        Left eye: No discharge.     Conjunctiva/sclera: Conjunctivae normal.     Right eye: Right conjunctiva is not injected. No chemosis.    Left eye: Left conjunctiva is not injected. No  chemosis.    Pupils: Pupils are equal, round, and reactive to light.  Cardiovascular:     Rate and Rhythm: Normal rate and regular rhythm.     Heart sounds: Normal heart sounds.  Pulmonary:     Effort: Pulmonary effort is normal. No tachypnea, accessory muscle usage or respiratory distress.     Breath sounds: Normal breath sounds. No wheezing, rhonchi or rales.  Chest:     Chest wall: No tenderness.  Lymphadenopathy:     Cervical: No cervical adenopathy.  Skin:    Coloration: Skin is not pale.     Findings: No abrasion, erythema, petechiae or rash. Rash is not papular, urticarial or vesicular.  Neurological:     Mental Status: He is alert.  Psychiatric:        Behavior: Behavior is cooperative.     Diagnostic studies: none     , MD  [facility]

## 2020-10-19 NOTE — Progress Notes (Signed)
Immunotherapy   Patient Details  Name: Jonathan Whitney MRN: [id number] Date of Birth: September 03, 1985  10/19/2020  Jonathan Whitney started injections for nasal polyps. Patient received 300 mg of Dupixent. Lot # [id number] Exp 10/29/2021 No loading dose.  Frequency: every 2 week Epi-Pen:Epi-Pen Available  Consent signed and patient instructions given.   Dub Mikes 10/19/2020, 10:59 AM

## 2020-10-20 ENCOUNTER — Encounter: Payer: Self-pay | Admitting: Allergy & Immunology

## 2020-10-20 DIAGNOSIS — J331 Polypoid sinus degeneration: Secondary | ICD-10-CM | POA: Insufficient documentation

## 2020-10-25 ENCOUNTER — Telehealth: Payer: Self-pay

## 2020-10-25 MED ORDER — DUPILUMAB 300 MG/2ML ~~LOC~~ SOSY
300.0000 mg | PREFILLED_SYRINGE | Freq: Once | SUBCUTANEOUS | Status: AC
  Administered 2020-10-19: 300 mg via SUBCUTANEOUS

## 2020-10-25 NOTE — Telephone Encounter (Signed)
Patient called to follow up on the patients Dupixent approval. Patient states the sample really helped him. Patient is wondering if the medication can be sent to the [facility] on [address]  Please Advise

## 2020-10-25 NOTE — Telephone Encounter (Signed)
Called patient and advised approval, copay card and submit to [employer] for Dupixent. Patient is aware of storage and dosing and may bring his next injection to clinic for injection

## 2020-10-25 NOTE — Addendum Note (Signed)
Addended by: Devoria Glassing on: 10/25/2020 04:12 PM   Modules accepted: Orders

## 2020-10-26 NOTE — Telephone Encounter (Signed)
That is great - thanks for everyone's help.  Malachi Bonds, MD [facility]

## 2021-01-11 ENCOUNTER — Ambulatory Visit (INDEPENDENT_AMBULATORY_CARE_PROVIDER_SITE_OTHER): Payer: No Typology Code available for payment source | Admitting: Allergy & Immunology

## 2021-01-11 ENCOUNTER — Other Ambulatory Visit: Payer: Self-pay

## 2021-01-11 ENCOUNTER — Encounter: Payer: Self-pay | Admitting: Allergy & Immunology

## 2021-01-11 VITALS — BP 124/86 | HR 100 | Temp 98.9°F | Resp 20 | Ht 73.0 in | Wt 277.6 lb

## 2021-01-11 DIAGNOSIS — J331 Polypoid sinus degeneration: Secondary | ICD-10-CM | POA: Diagnosis not present

## 2021-01-11 DIAGNOSIS — J4520 Mild intermittent asthma, uncomplicated: Secondary | ICD-10-CM

## 2021-01-11 DIAGNOSIS — J3089 Other allergic rhinitis: Secondary | ICD-10-CM | POA: Diagnosis not present

## 2021-01-11 DIAGNOSIS — J302 Other seasonal allergic rhinitis: Secondary | ICD-10-CM

## 2021-01-11 MED ORDER — RYALTRIS 665-25 MCG/ACT NA SUSP: 1.0000 | Freq: Two times a day (BID) | NASAL | 5 refills | Status: AC

## 2021-01-11 MED ORDER — PREDNISONE 10 MG PO TABS: ORAL_TABLET | ORAL | 0 refills | Status: AC

## 2021-01-11 NOTE — Patient Instructions (Addendum)
1. Mild intermittent asthma, uncomplicated - Lung testing deferred since you seemed to be doing fairly well. - Continue with albuterol 4 puffs every 4-6 hours. - There is no need for a controller medication at this time.  2. Chronic rhinitis (mourse, mixed feather, ragweed, trees, indoor molds, outdoor molds, dust mites, cat, dog and cockroach) - with overlying nasal polyps - Continue with Dupixent and try to give EVERY TWO WEEKS.  - We are going to start a prolonged prednisone taper.  - Check on the price of allergy shots to complement the Dupixent.  -  Use Sinex for 3-5 days MAX (and limit AFRIN as well).  - Start Ryaltris one spray per nostril twice daily (sample provided).   3. Return in about 4 months (around 05/12/2021).    Please inform us of any Emergency Department visits, hospitalizations, or changes in symptoms. Call us before going to the ED for breathing or allergy symptoms since we might be able to fit you in for a sick visit. Feel free to contact us anytime with any questions, problems, or concerns.  It was a pleasure to see you again today!  Websites that have reliable patient information: 1. American Academy of Asthma, Allergy, and Immunology: www.aaaai.org 2. Food Allergy Research and Education (FARE): foodallergy.org 3. Mothers of Asthmatics: http://www.asthmacommunitynetwork.org 4. American College of Allergy, Asthma, and Immunology: www.acaai.org   COVID-19 Vaccine Information can be found at: [email or web address] For questions related to vaccine distribution or appointments, please email [email or web address] or call [phone].   We realize that you might be concerned about having an allergic reaction to the COVID19 vaccines. To help with that concern, WE ARE OFFERING THE COVID19 VACCINES IN OUR OFFICE! Ask the front desk for dates!     Like [place] on [employer] and Instagram for our latest updates!       A healthy democracy works best when [employer] participate! Make sure you are registered to vote! If you have moved or changed any of your contact information, you will need to get this updated before voting!  In some cases, you MAY be able to register to vote online: [email or web address]

## 2021-01-11 NOTE — Progress Notes (Signed)
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 Serena Colonel) - failed fluticasone, rhinocort, Xhance, and azelastine  Rhinitis medicamentosa   Recurrent prednisone courses (4-6 in the last 12 years)   I thought he was doing a lot better after he got his stable abdomen duplex and at the last visit, but clearly that is not the case.  His compliance is not excellent, so I think we cannot call Dupixent failure at this point.  We are going to start a prolonged prednisone taper and I emphasized the need for him to get his Dupixent every 2 weeks schedule.  He is going to work on trying to make that better.  Another option is to change to mepolizumab since this was recently approved for nasal polyps.  We are also going to change his nose spray around. I think if he feels relief from the nasal spray he is more likely to actually use it.  We did warn against using Afrin or Afrin like nose sprays for prolonged periods of time.  Plan/Recommendations:   1. Mild intermittent asthma, uncomplicated - Lung testing deferred since you seemed to be doing fairly well. - Continue with albuterol 4 puffs every 4-6 hours. - There is no need for a controller medication at this time.  2. Chronic rhinitis (mourse, mixed feather, ragweed, trees, indoor molds, outdoor molds, dust mites, cat, dog and cockroach) - with overlying nasal polyps - Continue with Dupixent and try to give EVERY TWO WEEKS.  - We are going to start a prolonged prednisone taper.  - Check on the price of allergy shots to complement the Dupixent.  -  Use Sinex for 3-5 days MAX (and limit AFRIN as well).  - Start Ryaltris one spray per nostril twice  daily (sample provided).   3. Return in about 4 months (around 05/12/2021).    Subjective:   Jonathan Whitney is a 35 y.o. male presenting today for follow up of  Chief Complaint  Patient presents with   Follow-up    Allergies have been keeping patient from sleeping. Not able to breathe through nose and has dry mouth due to mouth breathing at night. ACT - 25    Jonathan Whitney has a history of the following: Patient Active Problem List   Diagnosis Date Noted   Polypoid sinus degeneration 10/20/2020   Mild intermittent asthma, uncomplicated 09/26/2019   Seasonal and perennial allergic rhinitis 09/26/2019   Reactive airway disease 07/22/2019   Seasonal allergic rhinitis due to pollen 07/22/2019   Vitamin D deficiency 07/22/2019   Elevated LFTs 07/22/2019   Dysthymia 07/10/2019   Morbid obesity (HCC) 07/10/2019   Healthcare maintenance 07/10/2019    History obtained from: chart review and patient.  Jonathan Whitney is a 35 y.o. male presenting for a follow up visit.  She was last seen in September 2022.  At that time, her asthma was under good control albuterol as needed.  For her chronic rhinitis, we gave her a sample of Dupixent for her coexisting nasal polyposis.  We continue with Xhance 1 spray per nostril twice daily.  Since last visit, he has mostly done well.   Asthma/Respiratory Symptom History: Asthma is under good control.  He has not needed his  rescue inhaler.  He has not needed prednisone for breathing.  He denies any nighttime awakenings.  Allergic Rhinitis Symptom History: He continues to have "stuff moments" where he cannot breathe through his nose. He is using Sinex this past Monday. This is usually when he is home and going to bed.  He does not feel that the Timmothy Sours is doing anything. He did have surgery with Dr. Pollyann Kennedy.  He has been trying to keep the Dupixent on schedule, but he tells me that he is mostly doing it every 3 weeks. The delivery of the timing is a problem  for him. He estimates that he has had a few injections but it is hard to get a number down. Maybe 4-5 in total. His pharmacist at work gives it to him. He really cannot tell how good the Dupixent has worked or not worked.  He gets 2 injections at a time, so 1 injection and then another RN time.  However, that he has issues with the delivery process and  That being triggered 4 weeks between 1 ejection and 1 daughter.  Prednisone dose help his symptoms a lot and it brings back his smell and taste. It does cause a lot of eating and appetite changes.  Work hours are 8-4pm.  He has not looked into getting allergy shots, but is open to trying that.  Otherwise, there have been no changes to his past medical history, surgical history, family history, or social history.    Review of Systems  Constitutional: Negative.  Negative for chills, fever, malaise/fatigue and weight loss.  HENT:  Positive for congestion and sinus pain. Negative for ear discharge and ear pain.   Eyes:  Negative for pain, discharge and redness.  Respiratory:  Negative for cough, sputum production, shortness of breath and wheezing.   Cardiovascular: Negative.  Negative for chest pain and palpitations.  Gastrointestinal:  Negative for abdominal pain, constipation, diarrhea, heartburn, nausea and vomiting.  Skin: Negative.  Negative for itching and rash.  Neurological:  Negative for dizziness and headaches.  Endo/Heme/Allergies:  Positive for environmental allergies. Does not bruise/bleed easily.      Objective:   Blood pressure 124/86, pulse 100, temperature 98.9 F (37.2 C), temperature source Temporal, resp. rate 20, height 6\' 1"  (1.854 m), weight 277 lb 9.6 oz (125.9 kg), SpO2 97 %. Body mass index is 36.62 kg/m.   Physical Exam:  Physical Exam Vitals reviewed.  Constitutional:      Appearance: He is well-developed.  HENT:     Head: Normocephalic and atraumatic.     Right Ear: Tympanic membrane, ear canal and  external ear normal.     Left Ear: Tympanic membrane, ear canal and external ear normal.     Nose: Mucosal edema and rhinorrhea present. No nasal deformity, septal deviation or laceration.     Right Turbinates: Enlarged, swollen and pale.     Left Turbinates: Enlarged, swollen and pale.     Right Sinus: No maxillary sinus tenderness or frontal sinus tenderness.     Left Sinus: No maxillary sinus tenderness or frontal sinus tenderness.     Comments: Polypoid sinus degeneration bilaterally, right greater than the left.  No epistaxis noted.    Mouth/Throat:     Mouth: Mucous membranes are not pale and not dry.     Pharynx: Uvula midline.  Eyes:     General: Lids are normal. No allergic shiner.       Right eye: No discharge.  Left eye: No discharge.     Conjunctiva/sclera: Conjunctivae normal.     Right eye: Right conjunctiva is not injected. No chemosis.    Left eye: Left conjunctiva is not injected. No chemosis.    Pupils: Pupils are equal, round, and reactive to light.  Cardiovascular:     Rate and Rhythm: Normal rate and regular rhythm.     Heart sounds: Normal heart sounds.  Pulmonary:     Effort: Pulmonary effort is normal. No tachypnea, accessory muscle usage or respiratory distress.     Breath sounds: Normal breath sounds. No wheezing, rhonchi or rales.     Comments: Moving air well in all lung fields.  No increased work of breathing. Chest:     Chest wall: No tenderness.  Lymphadenopathy:     Cervical: No cervical adenopathy.  Skin:    Coloration: Skin is not pale.     Findings: No abrasion, erythema, petechiae or rash. Rash is not papular, urticarial or vesicular.  Neurological:     Mental Status: He is alert.  Psychiatric:        Behavior: Behavior is cooperative.     Diagnostic studies: none      Malachi Bonds, MD  [facility]

## 2021-02-07 ENCOUNTER — Other Ambulatory Visit: Payer: Self-pay | Admitting: Allergy & Immunology

## 2021-03-22 ENCOUNTER — Other Ambulatory Visit: Payer: Self-pay | Admitting: Allergy & Immunology

## 2021-05-17 ENCOUNTER — Ambulatory Visit: Payer: No Typology Code available for payment source | Admitting: Allergy & Immunology

## 2021-05-17 DIAGNOSIS — J309 Allergic rhinitis, unspecified: Secondary | ICD-10-CM

## 2022-03-30 IMAGING — DX DG CHEST 2V
2 series · 2 of 2 positions shown · non-contrast
Comparison: None.

CLINICAL DATA: Substernal chest pain.  Cough.  History of asthma.

EXAM:
CHEST - 2 VIEW

[chest pa]
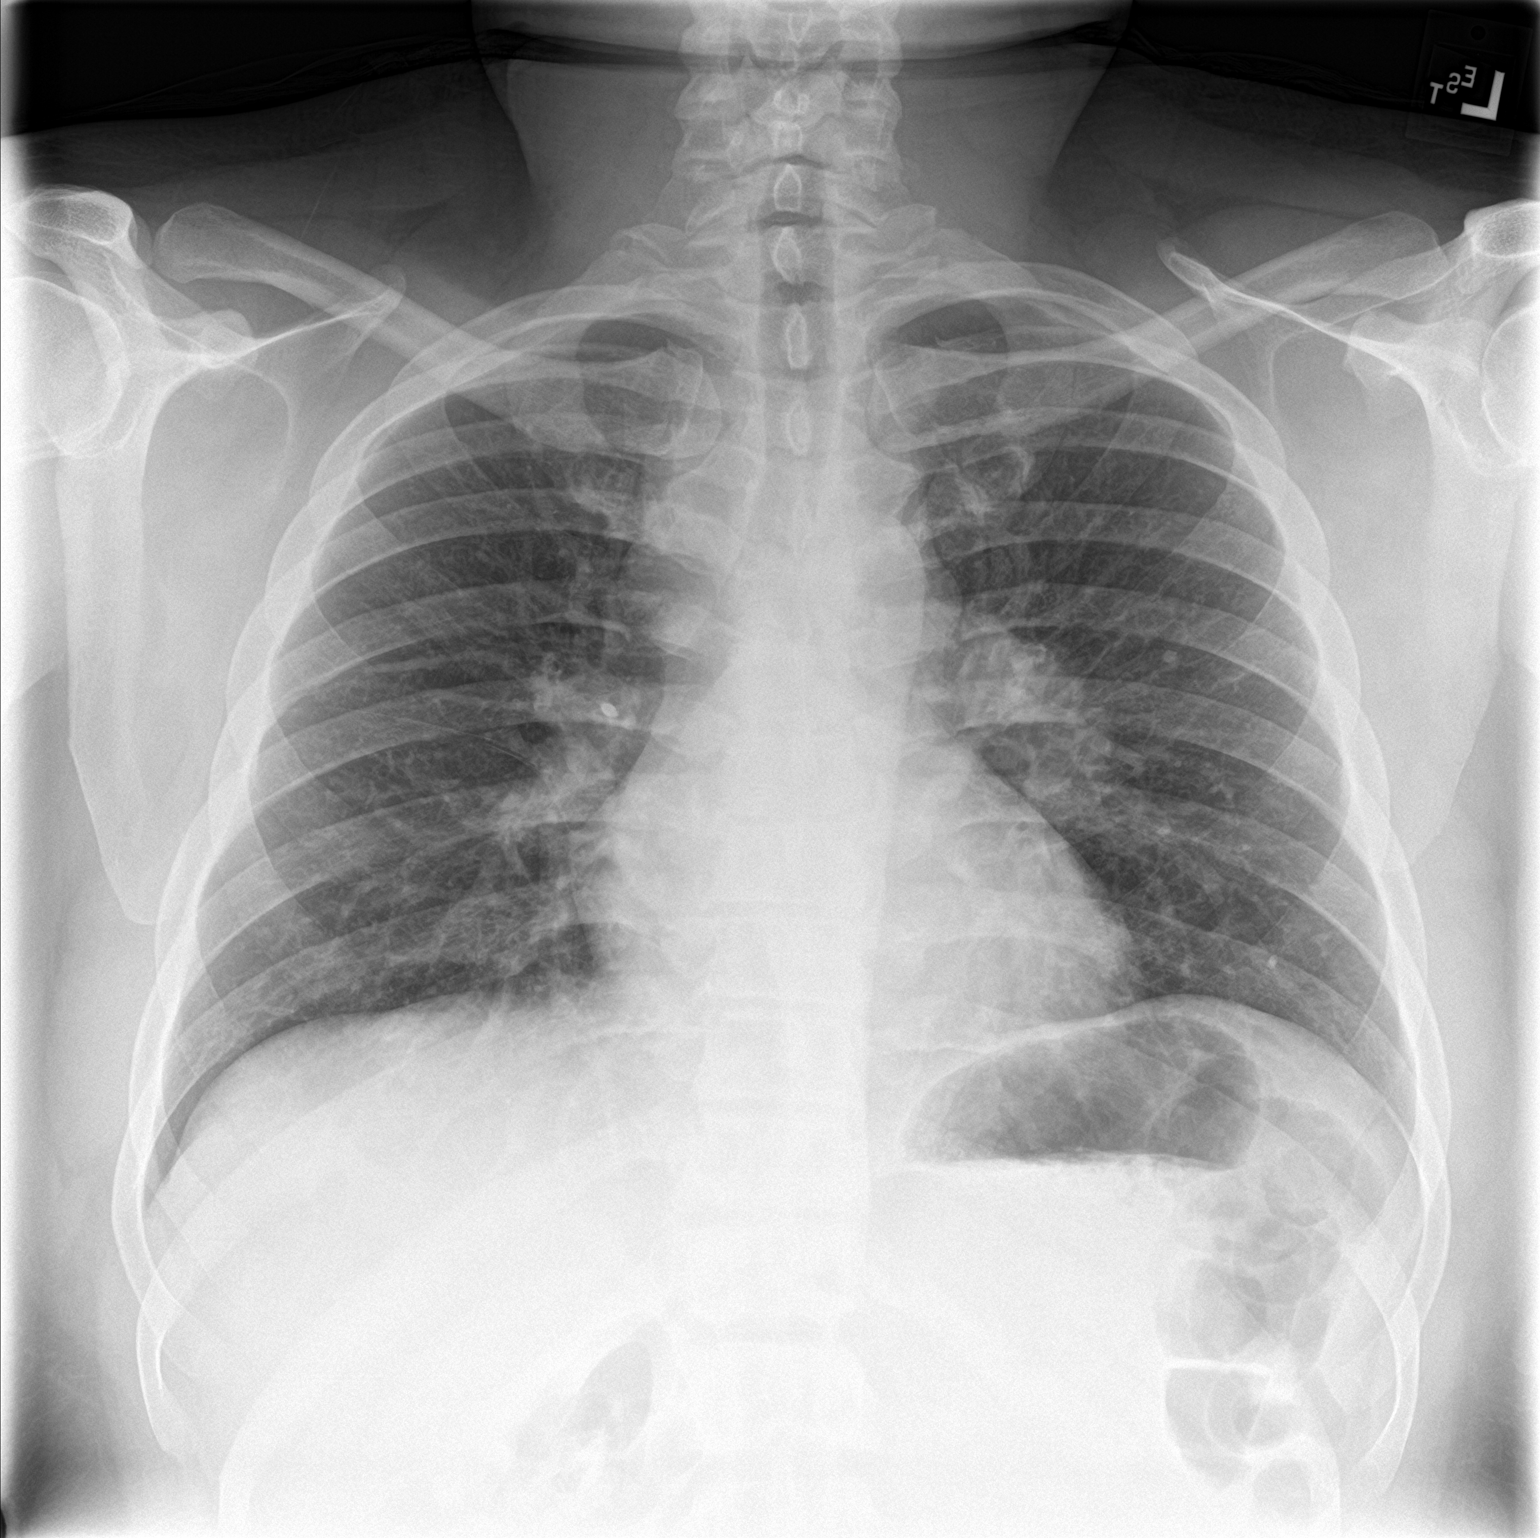

[chest lat]
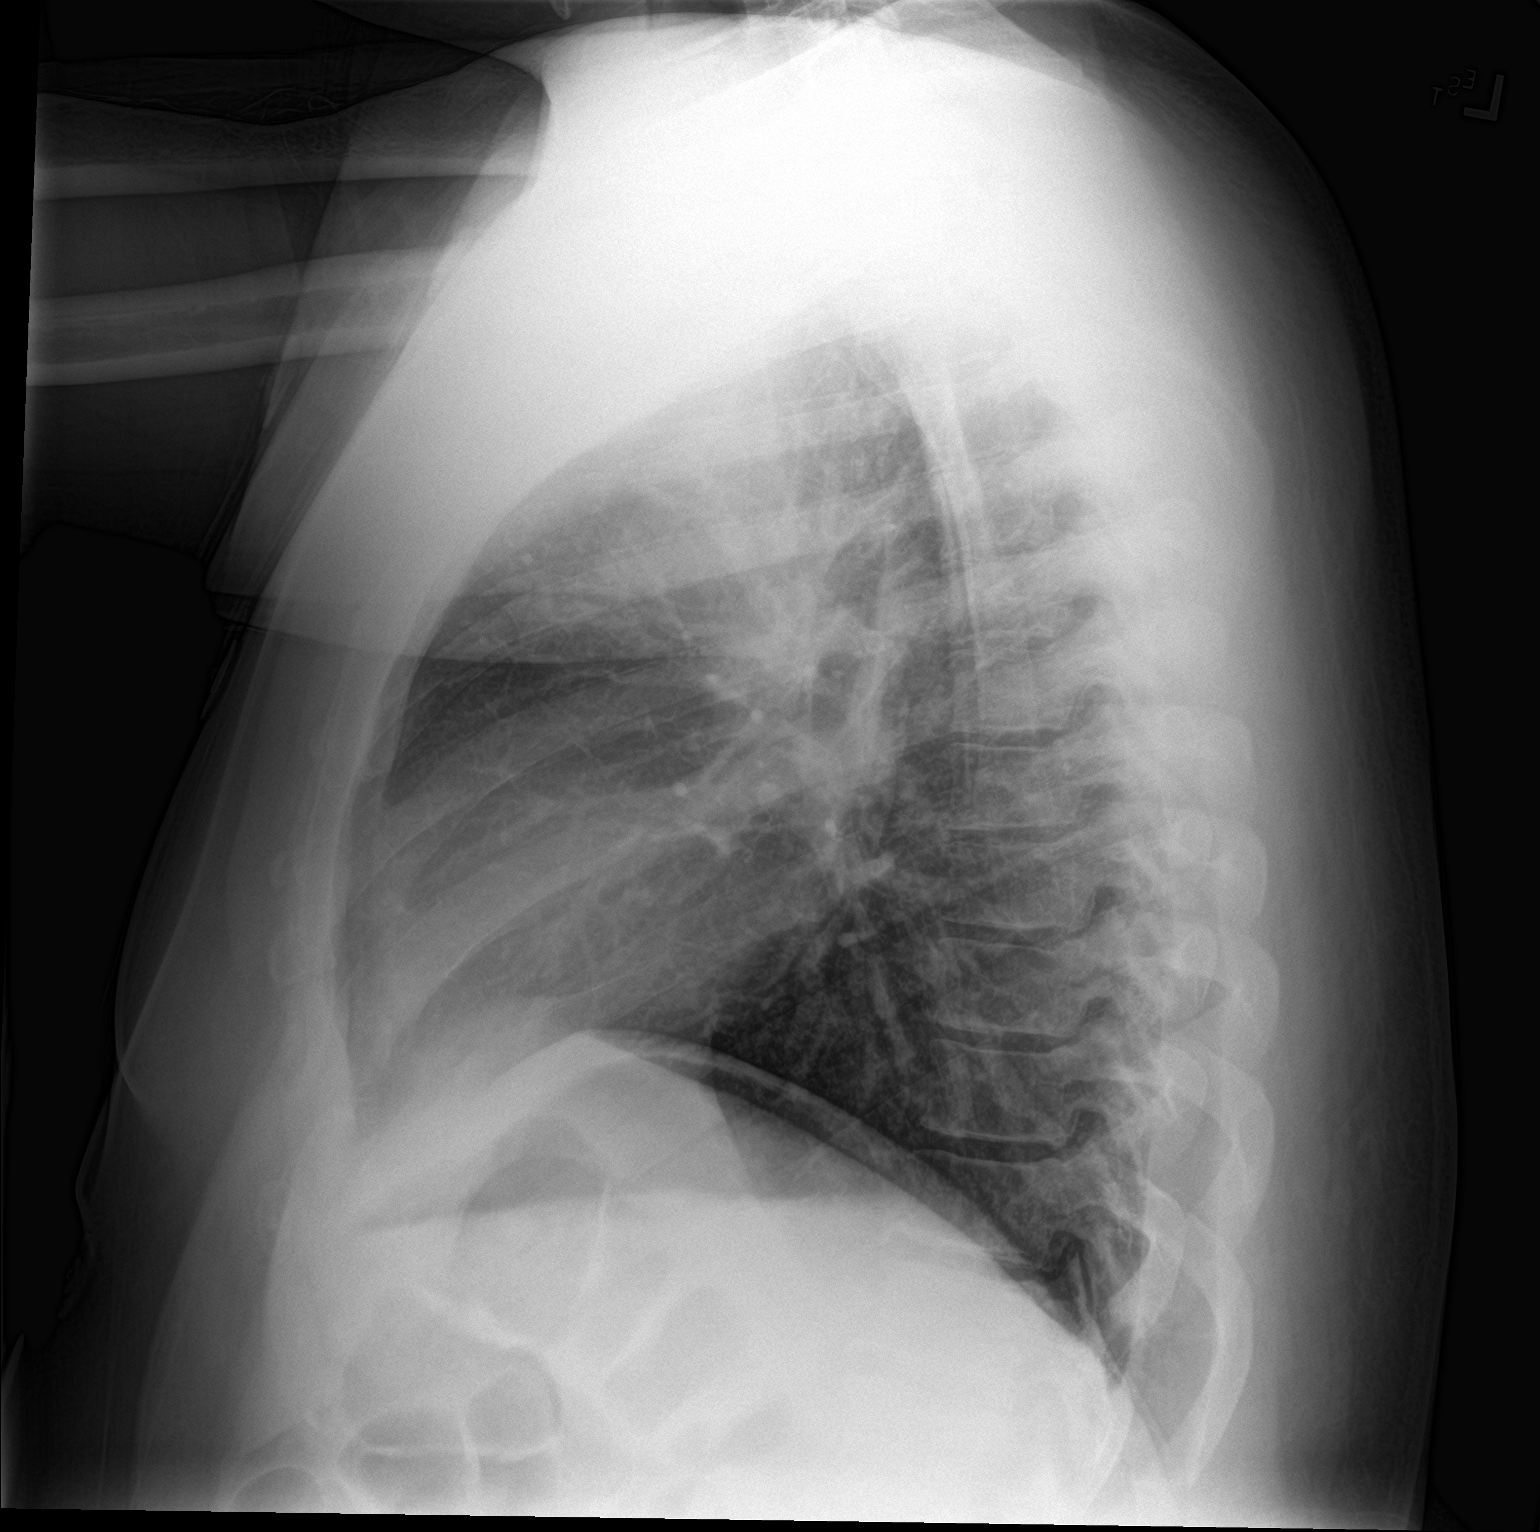

[2 of 2 positions shown; findings below may reference images not displayed]

FINDINGS: Heart is normal in size. There is mild bilateral hilar prominence,
left greater than right. Probable calcified granuloma in the left
mid lung. Mild peribronchial thickening. No focal airspace disease,
pleural effusion, or pneumothorax. No acute osseous abnormalities
are seen.
IMPRESSION: 1. Peribronchial thickening, can be seen with asthma or bronchitis.
2. Mild bilateral hilar prominence, left greater than right, may be
related to overlapping vascular structures, but the is not excluded,
particularly on the left. Consider further evaluation with
contrast-enhanced CT as indicated.

## 2022-07-17 ENCOUNTER — Encounter (HOSPITAL_BASED_OUTPATIENT_CLINIC_OR_DEPARTMENT_OTHER): Payer: Self-pay | Admitting: Emergency Medicine

## 2022-07-17 ENCOUNTER — Other Ambulatory Visit: Payer: Self-pay

## 2022-07-17 ENCOUNTER — Emergency Department (HOSPITAL_BASED_OUTPATIENT_CLINIC_OR_DEPARTMENT_OTHER)
Admission: EM | Admit: 2022-07-17 | Discharge: 2022-07-17 | Disposition: A | Discharge status: Home / Self Care | Payer: PRIVATE HEALTH INSURANCE | Attending: Emergency Medicine | Admitting: Emergency Medicine

## 2022-07-17 ENCOUNTER — Emergency Department (HOSPITAL_BASED_OUTPATIENT_CLINIC_OR_DEPARTMENT_OTHER): Payer: PRIVATE HEALTH INSURANCE

## 2022-07-17 DIAGNOSIS — J45909 Unspecified asthma, uncomplicated: Secondary | ICD-10-CM | POA: Insufficient documentation

## 2022-07-17 DIAGNOSIS — M899 Disorder of bone, unspecified: Secondary | ICD-10-CM | POA: Insufficient documentation

## 2022-07-17 DIAGNOSIS — R310 Gross hematuria: Secondary | ICD-10-CM | POA: Diagnosis present

## 2022-07-17 DIAGNOSIS — R300 Dysuria: Secondary | ICD-10-CM | POA: Diagnosis not present

## 2022-07-17 DIAGNOSIS — R161 Splenomegaly, not elsewhere classified: Secondary | ICD-10-CM | POA: Diagnosis not present

## 2022-07-17 LAB — IRON AND TIBC
Iron: 53 ug/dL (ref 45–182)
Saturation Ratios: 14 % — ABNORMAL LOW (ref 17.9–39.5)
TIBC: 374 ug/dL (ref 250–450)
UIBC: 321 ug/dL

## 2022-07-17 LAB — URINALYSIS, W/ REFLEX TO CULTURE (INFECTION SUSPECTED)
Bilirubin Urine: NEGATIVE
Glucose, UA: NEGATIVE mg/dL
Nitrite: NEGATIVE
Protein, ur: 30 mg/dL — AB
RBC / HPF: >50 RBC/hpf (ref 0–5)
Specific Gravity, Urine: 1.021 (ref 1.005–1.030)
pH: 6 (ref 5.0–8.0)

## 2022-07-17 LAB — CBC WITH DIFFERENTIAL/PLATELET
Abs Immature Granulocytes: 0.03 10*3/uL (ref 0.00–0.07)
Basophils Absolute: 0 10*3/uL (ref 0.0–0.1)
Basophils Relative: 0 %
Eosinophils Absolute: 0.3 10*3/uL (ref 0.0–0.5)
Eosinophils Relative: 7 %
HCT: 43.5 % (ref 39.0–52.0)
Hemoglobin: 14.4 g/dL (ref 13.0–17.0)
Immature Granulocytes: 1 %
Lymphocytes Relative: 24 %
Lymphs Abs: 1.2 10*3/uL (ref 0.7–4.0)
MCH: 27.3 pg (ref 26.0–34.0)
MCHC: 33.1 g/dL (ref 30.0–36.0)
MCV: 82.5 fL (ref 80.0–100.0)
Monocytes Absolute: 0.6 10*3/uL (ref 0.1–1.0)
Monocytes Relative: 13 %
Neutro Abs: 2.7 10*3/uL (ref 1.7–7.7)
Neutrophils Relative %: 55 %
Platelets: 285 10*3/uL (ref 150–400)
RBC: 5.27 MIL/uL (ref 4.22–5.81)
RDW: 14.1 % (ref 11.5–15.5)
WBC: 4.9 10*3/uL (ref 4.0–10.5)
nRBC: 0 % (ref 0.0–0.2)

## 2022-07-17 LAB — COMPREHENSIVE METABOLIC PANEL
ALT: 35 U/L (ref 0–44)
AST: 31 U/L (ref 15–41)
Albumin: 3.8 g/dL (ref 3.5–5.0)
Alkaline Phosphatase: 177 U/L — ABNORMAL HIGH (ref 38–126)
Anion gap: 10 (ref 5–15)
BUN: 8 mg/dL (ref 6–20)
CO2: 26 mmol/L (ref 22–32)
Calcium: 9.6 mg/dL (ref 8.9–10.3)
Chloride: 101 mmol/L (ref 98–111)
Creatinine, Ser: 0.9 mg/dL (ref 0.61–1.24)
GFR, Estimated: >60 mL/min (ref >60)
Glucose, Bld: 75 mg/dL (ref 70–99)
Potassium: 3.8 mmol/L (ref 3.5–5.1)
Sodium: 137 mmol/L (ref 135–145)
Total Bilirubin: 0.4 mg/dL (ref 0.3–1.2)
Total Protein: 9.1 g/dL — ABNORMAL HIGH (ref 6.5–8.1)

## 2022-07-17 LAB — RETICULOCYTES
Immature Retic Fract: 7 % (ref 2.3–15.9)
RBC.: 5.27 MIL/uL (ref 4.22–5.81)
Retic Count, Absolute: 65.9 10*3/uL (ref 19.0–186.0)
Retic Ct Pct: 1.3 % (ref 0.4–3.1)

## 2022-07-17 LAB — LACTATE DEHYDROGENASE: LDH: 169 U/L (ref 98–192)

## 2022-07-17 LAB — PROTIME-INR
INR: 1.1 (ref 0.8–1.2)
Prothrombin Time: 14.3 seconds (ref 11.4–15.2)

## 2022-07-17 LAB — VITAMIN B12: Vitamin B-12: 442 pg/mL (ref 180–914)

## 2022-07-17 LAB — FERRITIN: Ferritin: 92 ng/mL (ref 24–336)

## 2022-07-17 LAB — FOLATE: Folate: 6.7 ng/mL (ref >5.9)

## 2022-07-17 MED ORDER — CEPHALEXIN 500 MG PO CAPS: 500.0000 mg | ORAL_CAPSULE | Freq: Two times a day (BID) | ORAL | 0 refills | Status: AC

## 2022-07-17 MED ORDER — CEPHALEXIN 500 MG PO CAPS: 500.0000 mg | ORAL_CAPSULE | Freq: Two times a day (BID) | ORAL | 0 refills | Status: DC

## 2022-07-17 NOTE — ED Notes (Signed)
Patient verbalizes understanding of discharge instructions. Opportunity for questioning and answers were provided. Patient discharged from ED.  °

## 2022-07-17 NOTE — ED Provider Notes (Signed)
Emergency Department Provider Note   I have reviewed the triage vital signs and the nursing notes.   HISTORY  Chief Complaint Hematuria   HPI Jonathan Whitney is a 37 y.o. male with PMH of asthma presents to the emergency department for evaluation of gross hematuria.  Symptoms began yesterday with gross blood with urination and a "hot" feeling with urination. No fever or pain. No flank pain. He is not anticoagulated. No supplements or new workout routine/muscle soreness. No urethral discharge. Despite the blood, he has the sensation that he is urinating completely and without retention symptoms.    Past Medical History:  Diagnosis Date   Angio-edema    Asthma     Review of Systems  Constitutional: No fever/chills Cardiovascular: Denies chest pain. Respiratory: Denies shortness of breath. Gastrointestinal: No abdominal pain.  Genitourinary: Positive for dysuria and hematuria.  Musculoskeletal: Negative for back pain. Skin: Negative for rash. Neurological: Negative for headaches.   ____________________________________________   PHYSICAL EXAM:  VITAL SIGNS: ED Triage Vitals  Enc Vitals Group     BP 07/17/22 0758 (!) 151/93     Pulse Rate 07/17/22 0758 97     Resp 07/17/22 0758 16     Temp 07/17/22 0758 98.8 F (37.1 C)     Temp Source 07/17/22 0758 Oral     SpO2 07/17/22 0758 100 %     Weight 07/17/22 0759 250 lb (113.4 kg)     Height 07/17/22 0759 6\' 1"  (1.854 m)   Constitutional: Alert and oriented. Well appearing and in no acute distress. Eyes: Conjunctivae are normal.  Head: Atraumatic. Nose: No congestion/rhinnorhea. Mouth/Throat: Mucous membranes are moist.  Neck: No stridor.  Cardiovascular: Normal rate, regular rhythm. Good peripheral circulation. Grossly normal heart sounds.   Respiratory: Normal respiratory effort.  No retractions. Lungs CTAB. Gastrointestinal: Soft and nontender. No distention.  Musculoskeletal:  No gross deformities of  extremities. Neurologic:  Normal speech and language. Skin:  Skin is warm, dry and intact. No rash noted.   ____________________________________________   LABS (all labs ordered are listed, but only abnormal results are displayed)  Labs Reviewed  URINALYSIS, W/ REFLEX TO CULTURE (INFECTION SUSPECTED) - Abnormal; Notable for the following components:      Result Value   Color, Urine ORANGE (*)    APPearance HAZY (*)    Hgb urine dipstick LARGE (*)    Ketones, ur TRACE (*)    Protein, ur 30 (*)    Leukocytes,Ua SMALL (*)    Bacteria, UA RARE (*)    All other components within normal limits  COMPREHENSIVE METABOLIC PANEL - Abnormal; Notable for the following components:   Total Protein 9.1 (*)    Alkaline Phosphatase 177 (*)    All other components within normal limits  IRON AND TIBC - Abnormal; Notable for the following components:   Saturation Ratios 14 (*)    All other components within normal limits  URINE CULTURE  CBC WITH DIFFERENTIAL/PLATELET  PROTIME-INR  VITAMIN B12  FOLATE  FERRITIN  LACTATE DEHYDROGENASE  RETICULOCYTES  GC/CHLAMYDIA PROBE AMP ([place]) NOT AT [facility]   ____________________________________________   PROCEDURES  Procedure(s) performed:   Procedures  None  ____________________________________________   INITIAL IMPRESSION / ASSESSMENT AND PLAN / ED COURSE  Pertinent labs & imaging results that were available during my care of the patient were reviewed by me and considered in my medical decision making (see chart for details).   This patient is Presenting for Evaluation of hematuria, which  does require a range of treatment options, and is a complaint that involves a high risk of morbidity and mortality.  The Differential Diagnoses includes cystitis, STI, ureteral stone, bladder mass, etc.  I did obtain Additional Historical Information from partner at bedside.   Clinical Laboratory Tests Ordered, included   Radiologic Tests Ordered,  included CT renal. I independently interpreted the images and agree with radiology interpretation.   Cardiac Monitor Tracing which shows NSR.    Social Determinants of Health Risk patient is not an active smoker.   Medical Decision Making: Summary:  Patient presents emergency department with hematuria without significant pain.  Mild dysuria.  Overall vitals are reassuring exam is reassuring.  Plan for CT renal assess for large, intrarenal stones which may be causing painless bleeding.  UA along with culture and STI testing sent.   Reevaluation with update and discussion with patient. Discussed imaging and lab findings. Placed referral for hematology to help with close follow up. No urinary retention to require foley placement/irrigation.   Patient's presentation is most consistent with acute presentation with potential threat to life or bodily function.   Disposition: discharge  ____________________________________________  FINAL CLINICAL IMPRESSION(S) / ED DIAGNOSES  Final diagnoses:  Gross hematuria  Splenomegaly  Lytic bone lesions on xray     NEW OUTPATIENT MEDICATIONS STARTED DURING THIS VISIT:  Discharge Medication List as of 07/17/2022 12:33 PM     START taking these medications   Details  cephALEXin (KEFLEX) 500 MG capsule Take 1 capsule (500 mg total) by mouth 2 (two) times daily for 7 days., Starting Mon 07/17/2022, Until Mon 07/24/2022, Normal        Note:  This document was prepared using Dragon voice recognition software and may include unintentional dictation errors.  Alona Bene, MD, [facility]    Danita Proud, Arlyss Repress, MD 07/21/22 [phone]

## 2022-07-17 NOTE — ED Triage Notes (Signed)
Pt arrives to ED with c/o hematuria that started yesterday.

## 2022-07-17 NOTE — Discharge Instructions (Signed)
You were seen in the emergency department, and large spleen you were seen in the emergency department today with blood in the urine and enlarged spleen.  You have some abnormal findings on your CT scan and should follow with a hematologist.  I have placed a referral in our system to help with this.  Please call your primary care doctor this afternoon for close follow-up.  You should return to the ED with any worsening pain or inability to urinate.

## 2022-07-18 LAB — GC/CHLAMYDIA PROBE AMP (~~LOC~~) NOT AT ARMC
Chlamydia: NEGATIVE
Comment: NEGATIVE
Comment: NORMAL
Neisseria Gonorrhea: NEGATIVE

## 2022-07-18 LAB — URINE CULTURE: Culture: NO GROWTH
